# Patient Record
Sex: Male | Born: 1955
Health system: Southern US, Community
[De-identification: ages and names within clinical notes are randomized; demographics above are authoritative.]

## PROBLEM LIST (undated history)

## (undated) DIAGNOSIS — R519 Headache, unspecified: Secondary | ICD-10-CM

## (undated) DIAGNOSIS — R51 Headache: Secondary | ICD-10-CM

## (undated) DIAGNOSIS — M549 Dorsalgia, unspecified: Secondary | ICD-10-CM

## (undated) DIAGNOSIS — K219 Gastro-esophageal reflux disease without esophagitis: Secondary | ICD-10-CM

## (undated) DIAGNOSIS — E7800 Pure hypercholesterolemia, unspecified: Secondary | ICD-10-CM

## (undated) HISTORY — PX: TONSILLECTOMY: SUR1361

## (undated) HISTORY — DX: Dorsalgia, unspecified: M54.9

---

## 1999-11-11 ENCOUNTER — Inpatient Hospital Stay (HOSPITAL_COMMUNITY): Admission: EM | Admit: 1999-11-11 | Discharge: 1999-11-26 | Payer: Self-pay | Admitting: *Deleted

## 1999-12-09 ENCOUNTER — Inpatient Hospital Stay (HOSPITAL_COMMUNITY): Admission: AD | Admit: 1999-12-09 | Discharge: 1999-12-21 | Payer: Self-pay | Admitting: *Deleted

## 2000-01-24 ENCOUNTER — Inpatient Hospital Stay (HOSPITAL_COMMUNITY): Admission: EM | Admit: 2000-01-24 | Discharge: 2000-01-27 | Payer: Self-pay | Admitting: Emergency Medicine

## 2000-07-06 ENCOUNTER — Inpatient Hospital Stay (HOSPITAL_COMMUNITY): Admission: EM | Admit: 2000-07-06 | Discharge: 2000-07-16 | Payer: Self-pay | Admitting: *Deleted

## 2001-12-20 ENCOUNTER — Encounter: Payer: Self-pay | Admitting: Emergency Medicine

## 2001-12-20 ENCOUNTER — Emergency Department (HOSPITAL_COMMUNITY): Admission: EM | Admit: 2001-12-20 | Discharge: 2001-12-20 | Payer: Self-pay | Admitting: Emergency Medicine

## 2001-12-21 ENCOUNTER — Emergency Department (HOSPITAL_COMMUNITY): Admission: EM | Admit: 2001-12-21 | Discharge: 2001-12-21 | Payer: Self-pay | Admitting: Emergency Medicine

## 2001-12-21 ENCOUNTER — Encounter: Payer: Self-pay | Admitting: Emergency Medicine

## 2002-01-28 ENCOUNTER — Ambulatory Visit (HOSPITAL_COMMUNITY): Admission: RE | Admit: 2002-01-28 | Discharge: 2002-01-28 | Payer: Self-pay | Admitting: General Surgery

## 2002-01-28 ENCOUNTER — Encounter: Payer: Self-pay | Admitting: General Surgery

## 2002-03-21 ENCOUNTER — Encounter: Admission: RE | Admit: 2002-03-21 | Discharge: 2002-03-21 | Payer: Self-pay | Admitting: Family Medicine

## 2002-04-05 ENCOUNTER — Encounter: Admission: RE | Admit: 2002-04-05 | Discharge: 2002-04-05 | Payer: Self-pay | Admitting: Family Medicine

## 2002-04-18 ENCOUNTER — Encounter: Admission: RE | Admit: 2002-04-18 | Discharge: 2002-04-18 | Payer: Self-pay | Admitting: Family Medicine

## 2003-02-20 ENCOUNTER — Ambulatory Visit (HOSPITAL_COMMUNITY): Admission: RE | Admit: 2003-02-20 | Discharge: 2003-02-20 | Payer: Self-pay | Admitting: Neurosurgery

## 2003-08-27 ENCOUNTER — Emergency Department (HOSPITAL_COMMUNITY): Admission: EM | Admit: 2003-08-27 | Discharge: 2003-08-27 | Payer: Self-pay | Admitting: Emergency Medicine

## 2004-02-09 ENCOUNTER — Other Ambulatory Visit (HOSPITAL_COMMUNITY): Admission: RE | Admit: 2004-02-09 | Discharge: 2004-02-28 | Payer: Self-pay | Admitting: Psychiatry

## 2004-03-12 ENCOUNTER — Encounter: Admission: RE | Admit: 2004-03-12 | Discharge: 2004-03-12 | Payer: Self-pay | Admitting: Family Medicine

## 2004-03-13 ENCOUNTER — Encounter: Admission: RE | Admit: 2004-03-13 | Discharge: 2004-03-13 | Payer: Self-pay | Admitting: Psychiatry

## 2004-05-23 ENCOUNTER — Emergency Department (HOSPITAL_COMMUNITY): Admission: EM | Admit: 2004-05-23 | Discharge: 2004-05-23 | Payer: Self-pay | Admitting: *Deleted

## 2004-07-22 ENCOUNTER — Emergency Department (HOSPITAL_COMMUNITY): Admission: EM | Admit: 2004-07-22 | Discharge: 2004-07-22 | Payer: Self-pay | Admitting: Emergency Medicine

## 2005-03-20 ENCOUNTER — Ambulatory Visit (HOSPITAL_COMMUNITY): Admission: RE | Admit: 2005-03-20 | Discharge: 2005-03-20 | Payer: Self-pay | Admitting: Internal Medicine

## 2005-04-20 ENCOUNTER — Ambulatory Visit (HOSPITAL_BASED_OUTPATIENT_CLINIC_OR_DEPARTMENT_OTHER): Admission: RE | Admit: 2005-04-20 | Discharge: 2005-04-20 | Payer: Self-pay | Admitting: Internal Medicine

## 2005-04-27 ENCOUNTER — Ambulatory Visit: Payer: Self-pay | Admitting: Internal Medicine

## 2005-05-19 ENCOUNTER — Ambulatory Visit (HOSPITAL_BASED_OUTPATIENT_CLINIC_OR_DEPARTMENT_OTHER): Admission: RE | Admit: 2005-05-19 | Discharge: 2005-05-19 | Payer: Self-pay | Admitting: Internal Medicine

## 2005-06-06 ENCOUNTER — Ambulatory Visit: Payer: Self-pay | Admitting: Internal Medicine

## 2005-07-04 ENCOUNTER — Ambulatory Visit: Payer: Self-pay | Admitting: Internal Medicine

## 2005-08-18 ENCOUNTER — Ambulatory Visit (HOSPITAL_COMMUNITY): Admission: RE | Admit: 2005-08-18 | Discharge: 2005-08-18 | Payer: Self-pay | Admitting: Specialist

## 2005-08-18 ENCOUNTER — Ambulatory Visit (HOSPITAL_BASED_OUTPATIENT_CLINIC_OR_DEPARTMENT_OTHER): Admission: RE | Admit: 2005-08-18 | Discharge: 2005-08-18 | Payer: Self-pay | Admitting: Specialist

## 2005-08-18 ENCOUNTER — Encounter (INDEPENDENT_AMBULATORY_CARE_PROVIDER_SITE_OTHER): Payer: Self-pay | Admitting: Specialist

## 2005-11-27 ENCOUNTER — Ambulatory Visit: Payer: Self-pay | Admitting: Internal Medicine

## 2006-01-01 ENCOUNTER — Emergency Department (HOSPITAL_COMMUNITY): Admission: EM | Admit: 2006-01-01 | Discharge: 2006-01-01 | Payer: Self-pay | Admitting: Emergency Medicine

## 2006-01-06 ENCOUNTER — Emergency Department (HOSPITAL_COMMUNITY): Admission: EM | Admit: 2006-01-06 | Discharge: 2006-01-07 | Payer: Self-pay | Admitting: Emergency Medicine

## 2006-01-08 ENCOUNTER — Emergency Department (HOSPITAL_COMMUNITY): Admission: EM | Admit: 2006-01-08 | Discharge: 2006-01-08 | Payer: Self-pay | Admitting: Emergency Medicine

## 2006-05-05 ENCOUNTER — Emergency Department (HOSPITAL_COMMUNITY): Admission: EM | Admit: 2006-05-05 | Discharge: 2006-05-06 | Payer: Self-pay | Admitting: Emergency Medicine

## 2006-10-28 ENCOUNTER — Ambulatory Visit (HOSPITAL_COMMUNITY): Payer: Self-pay | Admitting: Psychiatry

## 2006-11-11 ENCOUNTER — Ambulatory Visit (HOSPITAL_COMMUNITY): Payer: Self-pay | Admitting: Psychiatry

## 2006-12-09 ENCOUNTER — Ambulatory Visit (HOSPITAL_COMMUNITY): Payer: Self-pay | Admitting: Psychiatry

## 2007-02-08 ENCOUNTER — Ambulatory Visit (HOSPITAL_COMMUNITY): Payer: Self-pay | Admitting: Psychiatry

## 2007-04-21 ENCOUNTER — Ambulatory Visit (HOSPITAL_COMMUNITY): Payer: Self-pay | Admitting: Psychiatry

## 2007-06-21 ENCOUNTER — Ambulatory Visit (HOSPITAL_COMMUNITY): Payer: Self-pay | Admitting: Psychiatry

## 2007-09-08 ENCOUNTER — Ambulatory Visit (HOSPITAL_COMMUNITY): Payer: Self-pay | Admitting: Psychiatry

## 2007-11-14 ENCOUNTER — Emergency Department (HOSPITAL_COMMUNITY): Admission: EM | Admit: 2007-11-14 | Discharge: 2007-11-14 | Payer: Self-pay | Admitting: Emergency Medicine

## 2007-11-21 ENCOUNTER — Emergency Department (HOSPITAL_COMMUNITY): Admission: EM | Admit: 2007-11-21 | Discharge: 2007-11-21 | Payer: Self-pay | Admitting: Emergency Medicine

## 2008-01-08 ENCOUNTER — Emergency Department (HOSPITAL_COMMUNITY): Admission: EM | Admit: 2008-01-08 | Discharge: 2008-01-08 | Payer: Self-pay | Admitting: Emergency Medicine

## 2008-01-11 ENCOUNTER — Emergency Department (HOSPITAL_COMMUNITY): Admission: EM | Admit: 2008-01-11 | Discharge: 2008-01-11 | Payer: Self-pay | Admitting: Emergency Medicine

## 2008-01-17 ENCOUNTER — Ambulatory Visit (HOSPITAL_COMMUNITY): Payer: Self-pay | Admitting: Psychiatry

## 2008-01-24 ENCOUNTER — Emergency Department (HOSPITAL_COMMUNITY): Admission: EM | Admit: 2008-01-24 | Discharge: 2008-01-24 | Payer: Self-pay | Admitting: Emergency Medicine

## 2008-02-05 ENCOUNTER — Emergency Department (HOSPITAL_COMMUNITY): Admission: EM | Admit: 2008-02-05 | Discharge: 2008-02-05 | Payer: Self-pay | Admitting: Emergency Medicine

## 2008-02-28 ENCOUNTER — Emergency Department (HOSPITAL_COMMUNITY): Admission: EM | Admit: 2008-02-28 | Discharge: 2008-02-28 | Payer: Self-pay | Admitting: Emergency Medicine

## 2008-04-03 ENCOUNTER — Ambulatory Visit (HOSPITAL_COMMUNITY): Payer: Self-pay | Admitting: Psychiatry

## 2008-04-04 ENCOUNTER — Emergency Department (HOSPITAL_COMMUNITY): Admission: EM | Admit: 2008-04-04 | Discharge: 2008-04-04 | Payer: Self-pay | Admitting: Emergency Medicine

## 2008-07-10 ENCOUNTER — Emergency Department (HOSPITAL_COMMUNITY): Admission: EM | Admit: 2008-07-10 | Discharge: 2008-07-10 | Payer: Self-pay | Admitting: Emergency Medicine

## 2008-08-06 ENCOUNTER — Emergency Department (HOSPITAL_COMMUNITY): Admission: EM | Admit: 2008-08-06 | Discharge: 2008-08-06 | Payer: Self-pay | Admitting: Emergency Medicine

## 2008-08-16 ENCOUNTER — Ambulatory Visit (HOSPITAL_COMMUNITY): Payer: Self-pay | Admitting: Psychiatry

## 2008-09-13 ENCOUNTER — Ambulatory Visit (HOSPITAL_COMMUNITY): Payer: Self-pay | Admitting: Psychiatry

## 2008-11-01 ENCOUNTER — Ambulatory Visit (HOSPITAL_COMMUNITY): Payer: Self-pay | Admitting: Psychiatry

## 2009-01-31 ENCOUNTER — Ambulatory Visit (HOSPITAL_COMMUNITY): Payer: Self-pay | Admitting: Psychiatry

## 2009-04-30 ENCOUNTER — Ambulatory Visit (HOSPITAL_COMMUNITY): Payer: Self-pay | Admitting: Psychiatry

## 2009-05-13 ENCOUNTER — Emergency Department (HOSPITAL_COMMUNITY): Admission: EM | Admit: 2009-05-13 | Discharge: 2009-05-13 | Payer: Self-pay | Admitting: Emergency Medicine

## 2009-08-20 ENCOUNTER — Ambulatory Visit (HOSPITAL_COMMUNITY): Payer: Self-pay | Admitting: Psychiatry

## 2009-11-26 ENCOUNTER — Ambulatory Visit (HOSPITAL_COMMUNITY): Payer: Self-pay | Admitting: Psychiatry

## 2010-02-18 ENCOUNTER — Ambulatory Visit (HOSPITAL_COMMUNITY): Payer: Self-pay | Admitting: Psychiatry

## 2010-04-15 ENCOUNTER — Ambulatory Visit (HOSPITAL_COMMUNITY): Payer: Self-pay | Admitting: Psychiatry

## 2010-07-17 ENCOUNTER — Ambulatory Visit (HOSPITAL_COMMUNITY): Payer: Self-pay | Admitting: Psychiatry

## 2010-10-07 ENCOUNTER — Ambulatory Visit (HOSPITAL_COMMUNITY): Payer: Self-pay | Admitting: Psychiatry

## 2010-12-09 ENCOUNTER — Ambulatory Visit (HOSPITAL_COMMUNITY): Payer: Self-pay | Admitting: Psychiatry

## 2011-01-19 ENCOUNTER — Encounter: Payer: Self-pay | Admitting: Internal Medicine

## 2011-03-03 ENCOUNTER — Encounter (HOSPITAL_COMMUNITY): Payer: Self-pay | Admitting: Psychiatry

## 2011-03-04 ENCOUNTER — Encounter (HOSPITAL_COMMUNITY): Payer: Self-pay | Admitting: *Deleted

## 2011-05-16 NOTE — Procedures (Signed)
Roger Lopez, Roger Lopez                ACCOUNT NO.:  [id number]   MEDICAL RECORD NO.:  [id number]          PATIENT TYPE:  OUT   LOCATION:  SLEEP CENTER                 FACILITY:  [facility]   PHYSICIAN:  Clinton D. Maple Hudson, M.D. DATE OF BIRTH:  May 13, 1956   DATE OF STUDY:  05/19/2005                              NOCTURNAL POLYSOMNOGRAM   REFERRING PHYSICIAN:  Dr. Fleet Contras   DATE OF STUDY:  May 19, 2005   INDICATION FOR STUDY:  Hypersomnia with sleep apnea.  Epworth Sleepiness  Score 9/24, BMI 34, weight 250 pounds.  Baseline diagnostic study on April 20, 2005 recorded RDI of 39.8 per hour.  CPAP titration is requested.   SLEEP ARCHITECTURE:  Total sleep time 334 minutes with sleep efficiency 91%.  Stage I was 5%, stage II 39%, stages III and IV 23%, REM was 33% of total  sleep time.  Sleep latency 10 minutes, REM latency 1.5 minutes, awake after  sleep onset 22 minutes, arousal index 23.   RESPIRATORY DATA:  CPAP titration protocol.  CPAP was titrated to 12 CWP,  RDI 0 per hour, using a large Respironics ComfortSelect 2 Full Face Mask  with heated humidifier.   OXYGEN DATA:  Moderate snoring was suppressed by CPAP with oxygen saturation  on CPAP 95-97% on room air.   CARDIAC DATA:  Normal sinus rhythm.   MOVEMENT/PARASOMNIA:  A total of 70 limb jerks were recorded of which 10  were associated arousal or awakening for a periodic limb movement with  arousal index of 1.8 per hour which is probably insignificant.   IMPRESSION/RECOMMENDATION:  1.  Successful continuous positive airway pressure titration to 12 CWP,      respiratory disturbance index 0 per hour, using a large Respironics      ComfortSelect 2 Full Face Mask with heated humidifier.  2.  Baseline NPSG on April 20, 2005 had recorded an respiratory disturbance      index of 39.8 per hour.      CDY/MEDQ  D:  05/25/2005 11:48:38  T:  05/25/2005 12:18:12  Job:  [id number]

## 2011-05-16 NOTE — Procedures (Signed)
NAMEGERARDO, CAIAZZO                ACCOUNT NO.:  [id number]   MEDICAL RECORD NO.:  [id number]          PATIENT TYPE:  OUT   LOCATION:  SLEEP CENTER                 FACILITY:  [facility]   PHYSICIAN:  Clinton D. Maple Hudson, M.D. DATE OF BIRTH:  Jun 06, 1956   DATE OF STUDY:  04/20/2005                              NOCTURNAL POLYSOMNOGRAM   REFERRING PHYSICIAN:  Fleet Contras, M.D.   INDICATIONS FOR STUDY:  Hypersomnia with sleep apnea. Epworth sleepiness  score 9/24, BMI 34, weight 250 pounds.   SLEEP ARCHITECTURE:  Total sleep time 259 minutes with sleep efficiency of  66%. Stage I was 9%, stage II was 57%, stages III and IV were 23%, REM was  11% of total sleep time. Sleep latency was 93 minutes. REM latency 115  minutes. Awake after sleep onset 45 minutes. Arousal index 43.   RESPIRATORY DATA:  Respiratory disturbance index (RDI, AHI) 9.8 obstructive  events per hour indicating severe obstructive sleep apnea/hypopnea syndrome.  This included 7 central apneas, 75 obstructive apneas, and 90 hypopneas.  Events and sleep were mostly while supine and on the left side.  REM RDI  124.  Sleep onset was delayed until 1:06 a.m.  The patient stated he had  difficulty sleeping because of the different atmosphere and air ducts.  There was insufficient remaining time to permit CPAP titration by protocol  on this study night.   OXYGEN DATA:  Moderate snoring with oxygen desaturation to a nadir to 76%.  Mean oxygen saturation through the study was 93% on room air.   CARDIAC DATA:  Normal sinus rhythm.   MOVEMENT/PARASOMNIA:  Occasional leg jerk. Bathroom times two.   IMPRESSION/RECOMMENDATIONS:  1.  Severe obstructive sleep apnea/hypopnea syndrome, RDI 39.8 per hour with      moderate snoring and oxygen desaturation to 76%. Note that this patient      has prior history of uvuloloplasty surgery.  2.  Consider return for CPAP titration or evaluate for alternative therapies      as appropriate. Would  suggest he bring a sleep medication with him on      return to facilitate study.     CDY/MEDQ  D:  04/27/2005 12:17:25  T:  04/27/2005 20:42:53  Job:  [id number]

## 2011-05-16 NOTE — Discharge Summary (Signed)
[place]  Patient:    Roger Lopez                        MRN: [id number] Adm. Date:  [id number] Disc. Date: [id number] Attending:  Farley Ly Dictator:   Marcelino Duster, M.D.                           Discharge Summary  FOLLOWUP APPOINTMENT:  In the [facility] on Friday, February 07, 2000 at 9:30 a.m.  CONSULTATIONS:  none.  DISCHARGE DIAGNOSES: 1. Acute prostatitis. 2. Hypotension. 3. Anemia. 4. History of depression.  DISCHARGE MEDICATIONS: 1. Ciprofloxacin 500 mg p.o. b.i.d. x 11 days (to complete a 14-day course). 2. Darvocet N-100 p.o. q.4-6h. p.r.n. pain (#20).  PROCEDURES:  None.  STUDIES:  None.  ADMISSION HISTORY:  Roger Lopez is a 55 year old African-American male with a past medical history significant for depression who presented with a three day history of increased urinary frequency and urgency and a one day history of fever, dysuria, perineal pain, perirectal and lower back pain, penile pain, increased urinary frequency, anorexia, rigors, and nausea and vomiting x 3.  He denies sexual contact in 1.5 months.  No penile discharge.  Has been producing dark urine.  ADMISSION PHYSICAL EXAMINATION:  VITAL SIGNS:  Temperature 100.1, blood pressure 80/46, pulse 94, and respirations 20.  Orthostatic blood pressure sitting was 76/50.  Blood pressure lying 97/56 ith a pulse of 78.  Blood pressure standing was 63/44 with a pulse of 114.  GENERAL:  The patient is alert and oriented seen lying in hospital bed shivering.  HEENT:  Head: NCAT.  Eyes:  Sclera anicteric.  Conjunctivae pink.  Oropharynx:  Clear without exudate.  NECK:  Supple.  No palpable lymphadenopathy.  CARDIAC:  Distant heart sounds.  Tachycardic.  Questionable S3 gallop.  No murmurs.  RESPIRATORY:  Clear to auscultation.  Good air movement.  ABDOMEN:  Soft.  NTND.  Positive bowel sounds.  No hepatosplenomegaly.  No masses.  RECTAL:  Enlarged, boggy,  very tender prostate.  Heme-negative.  EXTREMITIES:  No clubbing, cyanosis, or edema.  DP and radial pulses palpable bilaterally.  Extremities were warm to touch.  MUSCULOSKELETAL:  Positive tenderness to palpation perianally and over sacral region.  No CVA tenderness.  NEUROLOGIC:  Cranial nerves II-XII were grossly intact.  Sensation grossly intact bilaterally throughout.  LABORATORY DATA:  UA: Amber, turbid, positive for hemoglobin, protein, nitrates, leuk, negative for glucose, and positive for ketones.  WBC too numerous to count, RBC 21-50, and bacteria many.  WBC 10.9, hemoglobin 13.9, hematocrit 40.5, platelets 244, MCV 91, and ANC 8.6.  Sodium 136, potassium 3.8, chloride 102, CO2 33, BUN 13, creatinine 1.4, glucose 103, and calcium 9.4.  Total protein was 7.0, albumin 3.7, AST 26, ALT 16, ALP 47, and total bilirubin 0.9.  HOSPITAL COURSE: #1 - ACUTE PROSTATITIS:  Roger Lopez presented with a three day history of increased urinary frequency and a one day history of dysuria, fever, perineal/perianal pain, chills, nausea, and vomiting.  He was treated in the ED with IV Levaquin and azithromycin with Tylenol for pain.  His UA demonstrated positive for nitrates/leuk/hemoglobin/protein.  Rectal exam demonstrated significantly tender, enlarged, boggy prostate.  This plus the patients history was also ______  for acute prostatitis.  Urine culture grew E. coli that was sensitive to ciprofloxacin, therefore, the patients antibiotics were changed to Cipro 500 mg  IV b.i.d.  He as admitted to the step-down unit for observation secondary to questionable shock/sepsis (please see hospital course #2).  He was treated with two days of V antibiotics and then changed to Cipro p.o.  The patient had significantly decreasing dysuria throughout his hospital stay.  He was discharged on Cipro 500 mg p.o. b.i.d. for 11 days to complete a total of a 14 day course of antibiotics. He was also  discharged with Darvocet N-100 for pain.  He will follow up in the [place] [facility] on February 07, 2000 at 9:30 a.m.  #2 - HYPOTENSION:  On presentation, the patients blood pressure was 80/46, and e was significantly orthostatic by blood pressure and pulse (please see admission H&P under physical exam).  Concern about septic shock.  Therefore he was admitted to the step-down unit.  Blood cultures were drawn, and antibiotics were begun. His calculated water deficit was about 9 L.  Therefore, he was bolused with 7 L of normal saline (the patient was eating and drinking, and therefore we did not use D5).  After his bolus, he received 2-3 more liters at a rapid infusion rate. The next morning, his blood pressure had improved to 118/55, and his pulse decreased to 70-80.  His blood cultures were negative, and he was afebrile after antibiotics  were begun.  Therefore, the patient was transferred to the floor the day after admission.  He had no further evidence of sepsis/shock.  The patient felt well pon discharge.  #3 - ANEMIA:  Initial hemoglobin/hematocrit was 13.9/40.5, however, the patient was significantly orthostatic and dehydrated.  After fluid replacement, his hemoglobin/hematocrit decreased to 11.7/34.3.  Therefore, iron studies and B12/folate were gotten to characterize his anemia.  His total iron was low at 12. His TIBC was low at 212.  Iron saturation was low at 6%.  Ferritin was within normal limits at 211.  B12 and folate were also within normal limits at 358 and 9.0 respectively.  His low serum iron, low TIBC, and normal ferritin was suggestive of anemia of chronic disease.  The patient denies any known medical illnesses. The patient was asymptomatic from a heme standpoint at discharge.  Therefore, this should be worked-up as an outpatient.  DISCHARGE LABORATORY DATA:  None. DD:  01/27/00 TD:  01/27/00 Job: [id number]

## 2011-05-16 NOTE — H&P (Signed)
[facility]  Patient:    Roger Lopez, Roger Lopez                       MRN: [id number] Adm. Date:  [id number] Attending:  Otilio Saber Dictator:   Valinda Hoar, N.P.                   Psychiatric Admission Assessment  DATE OF ADMISSION:  July 06, 2000  IDENTIFYING INFORMATION:  Mr. Roger Lopez is a 55 year old African-American separated male admitted July 06, 2000, on a voluntary basis, and he was having command hallucinations telling him to kill himself.  HISTORY OF PRESENT ILLNESS:  The patient presented to [facility] ED last night stating that he was having command hallucinations to kill himself. Apparently, the voices have intensified over the last week.  Sleep has been poor, averaging two to three hours a night.  Appetite poor, with a weight loss of 15 pounds in one month.  He has been noncompliant with his medications.  He stopped his antipsychotic about three months ago and stopped his Prozac and trazodone about one week ago.  He stated he was afraid of what might happen if he was not admitted to the hospital last night.  He describes decreased energy, social withdrawal, takes naps during the day, anhedonia, angry, irritable.  Apparently, he went to [place], [place], to reconcile with his wife, and they were unable to reconcile.  He has been in [place] for approximately 1-1/2 weeks.  He also lists as a stressor his first cousin died of AIDS in 30-Apr-2000.  He reports that he has not really consumed that much alcohol.  He states he drank beer on the Fourth of July, and that was the first time he had had beer in four months.  Smoked marijuana July 05, 2000.  He said he smoked one joint.  He states he has not used crack cocaine for two years.  PAST PSYCHIATRIC HISTORY:  The patient has been in [place], [place], in treatment.  Was at the outpatient clinic at the mental health center in [place] up until 1-1/2 weeks ago.  He has been hospitalized at [facility] x 2, [facility] in [place] x 2, and he has been a past patient at [facility]; however, he has not gone there for treatment.  PAST MEDICAL HISTORY:  The patient has no primary care doctor.  Goes to the emergency room if he needs any medical care.  MEDICAL PROBLEMS:  The only thing he says he has is migraine headaches.  CURRENT MEDICATIONS: 1. Prozac 20 mg q.a.m. 2. Trazodone 100 mg at h.s.  He stopped these medications one week ago.  He thinks he was on Risperdal, and the dose is unknown, but he stopped that three months ago.  DRUG ALLERGIES:  No known drug allergies.  SOCIAL HISTORY:  The patient is living in an aunts house, has a roommate. He, again, has been in [place] since last week.  He tried to reconcile with his second wife in [place].  This did not work.  He had been married to her for five years.  He has three children from his first marriage, and they are ages 62, 33, 25, and live in [place].  He completed high school.  He, apparently, was in the [employer]	[place] for five years, and is on disability due to his mental illness.  FAMILY HISTORY:  Two sisters have schizophrenia.  ALCOHOL AND DRUG  HISTORY:  The patient states that he started using cocaine in 1993, alcohol at the age of 72, marijuana at the age of 60.  PHYSICAL EXAMINATION:  Please see physical exam done at [facility] ED.  There were no significant findings.  This was done on July 06, 2000.  MENTAL STATUS EXAMINATION:  Middle-aged adult male who is dressed in a hospitaL gown.  He is cooperative.  Speech low, relevant.  Mood sad, affect flat.  Positive for suicidal ideation.  Negative for homicidal ideation. Thought process positive for auditory hallucinations with command hallucinations.  Mild paranoia.  Suicidal, with a plan to kill self with gun. Cognitive:  Alert and oriented.  Cognitive functions intact.  Judgment is poor, insight is poor.  ADMISSION  DIAGNOSES: Axis I:    1. Schizophrenia, chronic, undifferentiated.            2. Polysubstance abuse. Axis II:   Deferred. Axis III:  Migraine headaches. Axis IV:   Severe, related to problems in the social environment, support            group. Axis V:    Current global assessment of functioning 39, past year 60.  CURRENT TREATMENT PLAN AND RECOMMENDATIONS:  Voluntary admission to [facility] Unit.  Check every 15 minutes, and maintain safety.  The patient can contract for safety.  He was given on admission Seroquel 50 mg p.o. stat.  He also has an order for Ambien 10 mg p.o. q.h.s. p.r.n.  We will review his last chart when he was here to determine what medications he was discharged on, since they seemed to help, before we order other medications. He does acknowledge he needs to be in the dual-diagnosis group and ______ .  TENTATIVE LENGTH OF STAY AND DISCHARGE PLAN:  Three days. DD:  07/06/00 TD:  07/06/00 Job: [id number]

## 2011-05-16 NOTE — Op Note (Signed)
Roger Lopez, Roger Lopez                ACCOUNT NO.:  [id number]   MEDICAL RECORD NO.:  [id number]          PATIENT TYPE:  AMB   LOCATION:  DSC                          FACILITY:  [facility]   PHYSICIAN:  Earvin Hansen L. Truesdale, M.D.DATE OF BIRTH:  [birth year]   DATE OF PROCEDURE:  08/18/2005  DATE OF DISCHARGE:                                 OPERATIVE REPORT   HISTORY OF PRESENT ILLNESS:  This is a 55 year old gentleman with a large  mass involving his lower mid back area.  The patient states that the mass  has gotten larger over the last year, year and a half with increased pain  and discomfort to the point where if he just puts pressure on the area it is  very uncomfortable.  It is in the midline of the back and therefore we are  going to use MAC anesthesia.  Day center surgery.   PROCEDURE:  Exploration and closure with plastic closure removal.   ANESTHESIA:  MAC anesthesia.   DESCRIPTION OF PROCEDURE:  The patient was taken to the operating room and  placed on the operating room table in the prone position.  He was given MAC  anesthesia.  Prep was done to the back using Hibiclens soap and solution and  walled off with sterile towels and drape to make a sterile field.  Then 1/2%  Xylocaine with epinephrine 1:200,000 concentration was injected locally a  total of 10 mL.  This was allowed to sit up and after this an incision was  made over the mass transversely, carried down through skin with #15 blade  and underlying deep subcutaneous tissue.  Next using Stevens scissors I was  able to identify the mass and using sharp and blunt dissection I was able to  remove the mass from the area deep under the fascia of the paraspinal  muscle.  After proper hemostasis the area was irrigated.  The wounds were  then closed in layers in a plastic fashion with 3-0 Monocryl x2 layers deep  subcutaneous tissue and a subdermal suture of 5-0 Monocryl and then a  running subcuticular stitch of 5-0 Monocryl.   Steri-strips and soft  dressings were applied to all the areas.  He withstood the procedures very  well and was taken to the recovery in excellent condition.      Roger Lopez, M.D.  Electronically Signed     GLT/MEDQ  D:  08/18/2005  T:  08/18/2005  Job:  [id number]

## 2011-07-14 ENCOUNTER — Encounter (HOSPITAL_COMMUNITY): Payer: Self-pay | Admitting: Psychiatry

## 2011-07-14 DIAGNOSIS — F259 Schizoaffective disorder, unspecified: Secondary | ICD-10-CM

## 2011-09-08 ENCOUNTER — Encounter (INDEPENDENT_AMBULATORY_CARE_PROVIDER_SITE_OTHER): Payer: Medicare Other | Admitting: Psychiatry

## 2011-09-08 DIAGNOSIS — F259 Schizoaffective disorder, unspecified: Secondary | ICD-10-CM

## 2011-09-18 LAB — URINALYSIS, ROUTINE W REFLEX MICROSCOPIC
Bilirubin Urine: NEGATIVE
Glucose, UA: NEGATIVE
Glucose, UA: NEGATIVE
Hgb urine dipstick: NEGATIVE
Hgb urine dipstick: NEGATIVE
Ketones, ur: NEGATIVE
Nitrite: NEGATIVE
Protein, ur: NEGATIVE
Specific Gravity, Urine: 1.021
Urobilinogen, UA: 0.2
pH: 5
pH: 6.5

## 2011-09-18 LAB — DIFFERENTIAL
Basophils Absolute: 0
Basophils Relative: 0
Eosinophils Absolute: 0.1
Eosinophils Relative: 1
Lymphocytes Relative: 55 — ABNORMAL HIGH
Lymphs Abs: 2.7
Monocytes Absolute: 0.3
Monocytes Relative: 7
Neutro Abs: 1.8
Neutrophils Relative %: 37 — ABNORMAL LOW

## 2011-09-18 LAB — BASIC METABOLIC PANEL
GFR calc non Af Amer: >60
Glucose, Bld: 102 — ABNORMAL HIGH
Potassium: 3.8
Sodium: 131 — ABNORMAL LOW

## 2011-09-18 LAB — CBC
HCT: 42
MCHC: 34.4
Platelets: 228
RBC: 4.53
WBC: 4.9

## 2011-11-01 ENCOUNTER — Other Ambulatory Visit (HOSPITAL_COMMUNITY): Payer: Self-pay

## 2011-11-03 ENCOUNTER — Encounter (HOSPITAL_COMMUNITY): Payer: Medicare Other | Admitting: Psychiatry

## 2011-11-26 ENCOUNTER — Ambulatory Visit (HOSPITAL_COMMUNITY): Payer: Medicare Other | Admitting: Psychiatry

## 2011-11-26 DIAGNOSIS — F200 Paranoid schizophrenia: Secondary | ICD-10-CM

## 2011-11-26 MED ORDER — BENZTROPINE MESYLATE 0.5 MG PO TABS: 0.5000 mg | ORAL_TABLET | Freq: Every day | ORAL | Status: DC

## 2011-11-26 MED ORDER — RISPERIDONE 2 MG PO TABS: 2.0000 mg | ORAL_TABLET | Freq: Every day | ORAL | Status: DC

## 2011-11-26 MED ORDER — TRAZODONE HCL 150 MG PO TABS: 150.0000 mg | ORAL_TABLET | Freq: Every day | ORAL | Status: DC

## 2011-11-26 NOTE — Progress Notes (Signed)
Thinking for his followup appointment. He is taking now trazodone 150 mg at bedtime. He is sleeping better. Recently he has no paranoid thinking or any hallucination. He described his mood is stable. There is no agitation anger or violent behavior. She's excited as she started coming with grandchild from [place] to visit and Christmas. He is very excited to have visit. Overall he has been doing good on medicine. He denies any side effects of medication including any tremors shakes hot extrapyramidal side effects.  Medicine examination Patient is casually dressed. He maintained good eye contact. His speech is soft clear and coherent. He denies any auditory hallucinations suicidal thoughts or homicidal thoughts. There are no psychotic symptoms present at this time. He is alert and oriented x3. His his attention and concentration is fair. His insight judgment and impulse control is okay.  Plan I will continue his current medication which is Risperdal 2 mg at bedtime Cogentin 0.5 mg at bedtime and trazodone 150 mg at bedtime. I review his blood work which was done in October 19, his CBC and basic chemistry is normal other than mild elevation of AST 47 and mild elevation of ALT 42, his BUN/creatinine is within normal limits. I have explained risks and benefits of medication and I will see him again in 2 months

## 2011-12-31 ENCOUNTER — Other Ambulatory Visit (HOSPITAL_COMMUNITY): Payer: Self-pay | Admitting: Psychiatry

## 2012-01-26 ENCOUNTER — Ambulatory Visit (INDEPENDENT_AMBULATORY_CARE_PROVIDER_SITE_OTHER): Payer: Medicare Other | Admitting: Psychiatry

## 2012-01-26 DIAGNOSIS — F200 Paranoid schizophrenia: Secondary | ICD-10-CM

## 2012-01-26 MED ORDER — BENZTROPINE MESYLATE 0.5 MG PO TABS: 0.5000 mg | ORAL_TABLET | Freq: Every day | ORAL | Status: DC

## 2012-01-26 MED ORDER — TRAZODONE HCL 150 MG PO TABS: 150.0000 mg | ORAL_TABLET | Freq: Every day | ORAL | Status: DC

## 2012-01-26 MED ORDER — RISPERIDONE 2 MG PO TABS: 2.0000 mg | ORAL_TABLET | Freq: Every day | ORAL | Status: DC

## 2012-01-26 NOTE — Progress Notes (Signed)
Patient came for his followup appointment. He's been compliant with his medication. He denies any side effects. His paranoia is less intense and less frequent. Recently he denies any hallucination. He had a very good Christmas and holidays. He likes to continue on his current medication. He denies any agitation anger or mood swings. He has no tremors shakes or extrapyramidal side effects.    Mental status examination Patient is casually dressed. He maintained good eye contact. His speech is soft clear and coherent. He denies any auditory hallucinations suicidal thoughts or homicidal thoughts. There are no psychotic symptoms present at this time. He is alert and oriented x3. His his attention and concentration is fair. His insight judgment and impulse control is okay.  Plan I will continue his current medication which is Risperdal 2 mg at bedtime Cogentin 0.5 mg at bedtime and trazodone 150 mg at bedtime.I have explained risks and benefits of medication and I will see him again in 2 months

## 2012-03-21 ENCOUNTER — Emergency Department (HOSPITAL_COMMUNITY)
Admission: EM | Admit: 2012-03-21 | Discharge: 2012-03-21 | Disposition: A | Discharge status: Home / Self Care | Payer: Medicare Other | Attending: Emergency Medicine | Admitting: Emergency Medicine

## 2012-03-21 ENCOUNTER — Encounter (HOSPITAL_COMMUNITY): Payer: Self-pay

## 2012-03-21 DIAGNOSIS — M79609 Pain in unspecified limb: Secondary | ICD-10-CM | POA: Insufficient documentation

## 2012-03-21 DIAGNOSIS — M545 Low back pain, unspecified: Secondary | ICD-10-CM | POA: Insufficient documentation

## 2012-03-21 DIAGNOSIS — M549 Dorsalgia, unspecified: Secondary | ICD-10-CM

## 2012-03-21 MED ORDER — HYDROMORPHONE HCL PF 1 MG/ML IJ SOLN: 1.0000 mg | Freq: Once | INTRAMUSCULAR | Status: DC

## 2012-03-21 MED ORDER — OXYCODONE-ACETAMINOPHEN 5-325 MG PO TABS: 1.0000 | ORAL_TABLET | Freq: Four times a day (QID) | ORAL | Status: AC | PRN

## 2012-03-21 MED ORDER — CYCLOBENZAPRINE HCL 10 MG PO TABS
10.0000 mg | ORAL_TABLET | Freq: Once | ORAL | Status: AC
  Administered 2012-03-21: 10 mg via ORAL
  Filled 2012-03-21: qty 1

## 2012-03-21 MED ORDER — ONDANSETRON HCL 4 MG/2ML IJ SOLN: 4.0000 mg | Freq: Once | INTRAMUSCULAR | Status: DC

## 2012-03-21 MED ORDER — ONDANSETRON 8 MG PO TBDP
8.0000 mg | ORAL_TABLET | Freq: Once | ORAL | Status: AC
  Administered 2012-03-21: 8 mg via ORAL
  Filled 2012-03-21: qty 1

## 2012-03-21 MED ORDER — HYDROMORPHONE HCL PF 2 MG/ML IJ SOLN
2.0000 mg | Freq: Once | INTRAMUSCULAR | Status: AC
  Administered 2012-03-21: 2 mg via INTRAMUSCULAR
  Filled 2012-03-21: qty 1

## 2012-03-21 MED ORDER — CYCLOBENZAPRINE HCL 10 MG PO TABS: 10.0000 mg | ORAL_TABLET | Freq: Two times a day (BID) | ORAL | Status: AC | PRN

## 2012-03-21 NOTE — ED Provider Notes (Signed)
History     CSN: [id number]  Arrival date & time 03/21/12  1535   First MD Initiated Contact with Patient 03/21/12 1724      Chief Complaint  Patient presents with  . Back Pain    (Consider location/radiation/quality/duration/timing/severity/associated sxs/prior treatment) HPI Comments: Patient with history of sciatica presents with bilateral lower back pain shooting down into both legs, left worse than right, for the past 4 days. Patient denies red flag signs and symptoms of lower back pain. He states that he sees Dr. Ethelene Hal for injections in his back. He has not needed them for approximately 3 years. Has been using over-the-counter medications at home for pain without relief. Nothing makes the pain better or worse.  Patient is a 56 y.o. male presenting with back pain. The history is provided by the patient.  Back Pain  This is a recurrent problem. The current episode started more than 2 days ago. The problem occurs constantly. The problem has not changed since onset.The pain is associated with no known injury. The pain is present in the lumbar spine. The quality of the pain is described as shooting. The pain radiates to the left thigh and right thigh. Pertinent negatives include no fever, no numbness, no weight loss, no bowel incontinence, no perianal numbness, no bladder incontinence, no paresthesias and no weakness. He has tried NSAIDs for the symptoms.    History reviewed. No pertinent past medical history.  History reviewed. No pertinent past surgical history.  No family history on file.  History  Substance Use Topics  . Smoking status: Never Smoker   . Smokeless tobacco: Not on file  . Alcohol Use:       Review of Systems  Constitutional: Negative for fever, weight loss and unexpected weight change.  Gastrointestinal: Negative for constipation and bowel incontinence.       Neg for fecal incontinence  Genitourinary: Negative for bladder incontinence, hematuria, flank  pain and difficulty urinating.       Negative for urinary incontinence or retention  Musculoskeletal: Positive for back pain.  Neurological: Negative for weakness, numbness and paresthesias.       Negative for saddle paresthesias     Allergies  Review of patient's allergies indicates no known allergies.  Home Medications   Current Outpatient Rx  Name Route Sig Dispense Refill  . ATORVASTATIN CALCIUM 40 MG PO TABS Oral Take 40 mg by mouth daily.     Marland Kitchen BENZTROPINE MESYLATE 0.5 MG PO TABS Oral Take 1 tablet (0.5 mg total) by mouth at bedtime. 30 tablet 1  . FLUTICASONE PROPIONATE 50 MCG/ACT NA SUSP      . NAPROXEN SODIUM 220 MG PO TABS Oral Take 440 mg by mouth 2 (two) times daily with a meal.    . NEXIUM 40 MG PO CPDR Oral Take 40 mg by mouth daily.     . TRAMADOL HCL 50 MG PO TABS      . TRAZODONE HCL 150 MG PO TABS Oral Take 1 tablet (150 mg total) by mouth at bedtime. 30 tablet 1    BP 143/77  Pulse 86  Temp(Src) 98.3 F (36.8 C) (Oral)  Resp 20  SpO2 95%  Physical Exam  Nursing note and vitals reviewed. Constitutional: He is oriented to person, place, and time. He appears well-developed and well-nourished.  HENT:  Head: Normocephalic and atraumatic.  Eyes: Conjunctivae are normal.  Neck: Normal range of motion.  Abdominal: Soft. There is no tenderness. There is no CVA tenderness.  Musculoskeletal: Normal range of motion. He exhibits no tenderness.       Right hip: Normal.       Left hip: Normal.       Cervical back: He exhibits no tenderness and no bony tenderness.       Thoracic back: He exhibits no tenderness and no bony tenderness.       Lumbar back: He exhibits tenderness. He exhibits no bony tenderness.       No step-off noted with palpation of spine.   Neurological: He is alert and oriented to person, place, and time. He has normal reflexes. No sensory deficit. He exhibits normal muscle tone.       5/5 strength in entire lower extremities bilaterally. No  sensation deficit.   Skin: Skin is warm and dry.  Psychiatric: He has a normal mood and affect.    ED Course  Procedures (including critical care time)  Labs Reviewed - No data to display No results found.   1. Back pain     5:41 PM Patient seen and examined. Medications ordered.   Vital signs reviewed and are as follows: Filed Vitals:   03/21/12 1549  BP: 143/77  Pulse: 86  Temp: 98.3 F (36.8 C)  Resp: 20   No red flag s/s of low back pain. Patient was counseled on back pain precautions and told to do activity as tolerated but do not lift, push, or pull heavy objects more than 10 pounds for the next week.  Patient counseled to use ice or heat on back for no longer than 15 minutes every hour.   Patient prescribed muscle relaxer and counseled on proper use of muscle relaxant medication.    Patient prescribed narcotic pain medicine and counseled on proper use of narcotic pain medications. Counseled not to combine this medication with others containing tylenol.   Urged patient not to drink alcohol, drive, or perform any other activities that requires focus while taking either of these medications.  Patient urged to follow-up with PCP if pain does not improve with treatment and rest or if pain becomes recurrent. Urged to return with worsening severe pain, loss of bowel or bladder control, trouble walking.   The patient verbalizes understanding and agrees with the plan.   MDM  Patient with back pain. History of same. No neurological deficits. Patient is ambulatory. No warning symptoms, other than age > 22 ,of back pain including: loss of bowel or bladder control, night sweats, waking from sleep with back pain, unexplained fevers or weight loss, h/o cancer, IVDU, recent trauma. No concern for cauda equina, epidural abscess, or other serious cause of back pain. Conservative measures such as rest, ice/heat and pain medicine indicated with PCP follow-up if no improvement with  conservative management.          Renne Crigler, [place] 03/21/12 [phone]

## 2012-03-21 NOTE — Discharge Instructions (Signed)
Please read and follow all provided instructions.  Your diagnoses today include:  1. Back pain     Tests performed today include:  Vital signs - see below for your results today  Medications prescribed:   Percocet (oxycodone/acetaminophen) - narcotic pain medication   You have been prescribed narcotic pain medication such as Vicodin or Percocet: DO NOT drive or perform any activities that require you to be awake and alert because this medicine can make you drowsy. BE VERY CAREFUL not to take multiple medicines containing Tylenol (also called acetaminophen). Doing so can lead to an overdose which can damage your liver and cause liver failure and possibly death.    Flexeril (cyclobenzaprine) - muscle relaxer medication  You have been prescribed a muscle relaxer medication such as Robaxin, Flexeril, or Valium: DO NOT drive or perform any activities that require you to be awake and alert because this medicine can make you drowsy.   You have been prescribed an anti-inflammatory medication or NSAID. Take with food. Take smallest effective dose for the shortest duration needed for your pain. Stop taking if you experience stomach pain or vomiting.   Take any prescribed medications only as directed.  Home care instructions:   Follow any educational materials contained in this packet  Please rest, use ice or heat on your back for the next several days  Do not lift, push, pull anything more than 10 pounds for the next week  Follow-up instructions: Please follow-up with your primary care provider in the next 3 days for further evaluation of your symptoms. If you do not have a primary care doctor -- see below for referral information.   Return instructions:  SEEK IMMEDIATE MEDICAL ATTENTION IF YOU HAVE:  New numbness, tingling, weakness, or problem with the use of your arms or legs  Severe back pain not relieved with medications  Loss control of your bowels or bladder  Increasing pain  in any areas of the body (such as chest or abdominal pain)  Shortness of breath, dizziness, or fainting.   Worsening nausea (feeling sick to your stomach), vomiting, fever, or sweats  Any other emergent concerns regarding your health   Additional Information:  Your vital signs today were: BP 143/77  Pulse 86  Temp(Src) 98.3 F (36.8 C) (Oral)  Resp 20  SpO2 95% If your blood pressure (BP) was elevated above 135/85 this visit, please have this repeated by your doctor within one month. -------------- No Primary Care Doctor Call [facility]  [phone] Other agencies that provide inexpensive medical care    [facility]  [phone]    [facility]  [phone]    [facility]  [phone]    [facility]  [phone]    [facility]  [phone]    [facility]  [phone] -------------- RESOURCE GUIDE:  Dental Problems  Patients with Medicaid: [facility] Dental [phone] [address]                                            [phone] Phone:  [phone]  Phone:  [phone]  If unable to pay or uninsured, contact:  [facility] or [facility]. to become qualified for the adult dental clinic.  Chronic Pain Problems Contact [facility]  [phone] Patients need to be referred by their primary care doctor.  Insufficient Money for Medicine Contact [employer]:  call "211" or [facility] [phone].  Psychological Services [facility]  [phone] [facility]  [phone] [facility] Mental [facility]   [phone] (emergency services [phone])  Substance Abuse Resources [facility]  [phone] [facility] [phone] [facility] [phone] Floydene Flock [phone] Residential & Outpatient Substance Abuse Program   [phone]  Abuse/Neglect [facility] Child Abuse Hotline [phone] [place] Child Abuse Hotline [phone] (After Hours)  Emergency Shelter [facility] [phone]  Maternity Homes [facility] at [facility] [phone] [facility] [phone]  [facility]  [facility]     [employer]                          [facility] Dept. [address] [place]                       [address]      [address]  Blondell Reveal Phone:  [phone]                                   Phone:  [phone]                 Phone:  [phone]  [facility] Mental [facility] Phone:  [phone]  [facility] Child Abuse Hotline [phone] (After Hours)

## 2012-03-21 NOTE — ED Notes (Signed)
States feels better.

## 2012-03-21 NOTE — ED Notes (Signed)
Patient reports that he has ongoing backpain x 4 days, hx of same. Reports that the pain is radiating down both legs. Pain worse with any change in position

## 2012-03-22 NOTE — ED Provider Notes (Signed)
Medical screening examination/treatment/procedure(s) were performed by non-physician practitioner and as supervising physician I was immediately available for consultation/collaboration. Devoria Albe, MD, Armando Gang   Ward Givens, MD 03/22/12 1040

## 2012-03-29 ENCOUNTER — Ambulatory Visit (HOSPITAL_COMMUNITY): Payer: Medicare Other | Admitting: Psychiatry

## 2012-03-29 ENCOUNTER — Encounter (HOSPITAL_COMMUNITY): Payer: Self-pay | Admitting: *Deleted

## 2012-03-29 ENCOUNTER — Emergency Department (HOSPITAL_COMMUNITY)
Admission: EM | Admit: 2012-03-29 | Discharge: 2012-03-29 | Disposition: A | Discharge status: Home / Self Care | Payer: Medicare Other | Attending: Emergency Medicine | Admitting: Emergency Medicine

## 2012-03-29 DIAGNOSIS — M549 Dorsalgia, unspecified: Secondary | ICD-10-CM | POA: Insufficient documentation

## 2012-03-29 DIAGNOSIS — K219 Gastro-esophageal reflux disease without esophagitis: Secondary | ICD-10-CM | POA: Insufficient documentation

## 2012-03-29 DIAGNOSIS — E7800 Pure hypercholesterolemia, unspecified: Secondary | ICD-10-CM | POA: Insufficient documentation

## 2012-03-29 DIAGNOSIS — M5430 Sciatica, unspecified side: Secondary | ICD-10-CM | POA: Insufficient documentation

## 2012-03-29 DIAGNOSIS — Z79899 Other long term (current) drug therapy: Secondary | ICD-10-CM | POA: Insufficient documentation

## 2012-03-29 DIAGNOSIS — R209 Unspecified disturbances of skin sensation: Secondary | ICD-10-CM | POA: Insufficient documentation

## 2012-03-29 HISTORY — DX: Gastro-esophageal reflux disease without esophagitis: K21.9

## 2012-03-29 HISTORY — DX: Pure hypercholesterolemia, unspecified: E78.00

## 2012-03-29 MED ORDER — HYDROMORPHONE HCL PF 1 MG/ML IJ SOLN
1.0000 mg | Freq: Once | INTRAMUSCULAR | Status: AC
  Administered 2012-03-29: 1 mg via INTRAMUSCULAR
  Filled 2012-03-29: qty 1

## 2012-03-29 MED ORDER — OXYCODONE-ACETAMINOPHEN 5-325 MG PO TABS: 1.0000 | ORAL_TABLET | Freq: Four times a day (QID) | ORAL | Status: AC | PRN

## 2012-03-29 MED ORDER — CYCLOBENZAPRINE HCL 10 MG PO TABS: 10.0000 mg | ORAL_TABLET | Freq: Two times a day (BID) | ORAL | Status: AC | PRN

## 2012-03-29 NOTE — Discharge Instructions (Signed)
Followup with orthopedics tomorrow.  Use conservative methods at home including heat therapy and cold therapy as we discussed. More information on cold therapy is listed below.  It is not recommended to use heat treatment directly after an acute injury.  SEEK IMMEDIATE MEDICAL ATTENTION IF: New numbness, tingling, weakness, or problem with the use of your arms or legs.  Severe back pain not relieved with medications.  Change in bowel or bladder control.  Increasing pain in any areas of the body (such as chest or abdominal pain).  Shortness of breath, dizziness or fainting.  Nausea (feeling sick to your stomach), vomiting, fever, or sweats.  COLD THERAPY DIRECTIONS:  Ice or gel packs can be used to reduce both pain and swelling. Ice is the most helpful within the first 24 to 48 hours after an injury or flareup from overusing a muscle or joint.  Ice is effective, has very few side effects, and is safe for most people to use.   If you expose your skin to cold temperatures for too long or without the proper protection, you can damage your skin or nerves. Watch for signs of skin damage due to cold.   HOME CARE INSTRUCTIONS  Follow these tips to use ice and cold packs safely.  Place a dry or damp towel between the ice and skin. A damp towel will cool the skin more quickly, so you may need to shorten the time that the ice is used.  For a more rapid response, add gentle compression to the ice.  Ice for no more than 10 to 20 minutes at a time. The bonier the area you are icing, the less time it will take to get the benefits of ice.  Check your skin after 5 minutes to make sure there are no signs of a poor response to cold or skin damage.  Rest 20 minutes or more in between uses.  Once your skin is numb, you can end your treatment. You can test numbness by very lightly touching your skin. The touch should be so light that you do not see the skin dimple from the pressure of your fingertip. When using ice,  most people will feel these normal sensations in this order: cold, burning, aching, and numbness.  Do not use ice on someone who cannot communicate their responses to pain, such as small children or people with dementia.   HOW TO MAKE AN ICE PACK  To make an ice pack, do one of the following:  Place crushed ice or a bag of frozen vegetables in a sealable plastic bag. Squeeze out the excess air. Place this bag inside another plastic bag. Slide the bag into a pillowcase or place a damp towel between your skin and the bag.  Mix 3 parts water with 1 part rubbing alcohol. Freeze the mixture in a sealable plastic bag. When you remove the mixture from the freezer, it will be slushy. Squeeze out the excess air. Place this bag inside another plastic bag. Slide the bag into a pillowcase or place a damp towel between your skin and the bag.   SEEK MEDICAL CARE IF:  You develop white spots on your skin. This may give the skin a blotchy (mottled) appearance.  Your skin turns blue or pale.  Your skin becomes waxy or hard.  Your swelling gets worse.  MAKE SURE YOU:  Understand these instructions.  Will watch your condition.  Will get help right away if you are not doing well or get  worse.    Chronic Pain Discharge Instructions  Emergency care providers appreciate that many patients coming to [place] are in severe pain and we wish to address their pain in the safest, most responsible manner.  It is important to recognize however, that the proper treatment of chronic pain differs from that of the pain of injuries and acute illnesses.  Our goal is to provide quality, safe, personalized care and we thank you for giving [place] the opportunity to serve you. The use of narcotics and related agents for chronic pain syndromes may lead to additional physical and psychological problems.  Nearly as many people die from prescription narcotics each year as die from car crashes.  Additionally, this risk is increased if such  prescriptions are obtained from a variety of sources.  Therefore, only your primary care physician or a pain management specialist is able to safely treat such syndromes with narcotic medications long-term.    Documentation revealing such prescriptions have been sought from multiple sources may prohibit [place] from providing a refill or different narcotic medication.  Your name may be checked first through the [facility] Controlled Substances Reporting System.  This database is a record of controlled substance medication prescriptions that the patient has received.  This has been established by [facility] in an effort to eliminate the dangerous, and often life threatening, practice of obtaining multiple prescriptions from different medical providers.   If you have a chronic pain syndrome (i.e. chronic headaches, recurrent back or neck pain, dental pain, abdominal or pelvis pain without a specific diagnosis, or neuropathic pain such as fibromyalgia) or recurrent visits for the same condition without an acute diagnosis, you may be treated with non-narcotics and other non-addictive medicines.  Allergic reactions or negative side effects that may be reported by a patient to such medications will not typically lead to the use of a narcotic analgesic or other controlled substance as an alternative.   Patients managing chronic pain with a personal physician should have provisions in place for breakthrough pain.  If you are in crisis, you should call your physician.  If your physician directs you to the emergency department, please have the doctor call and speak to our attending physician concerning your care.   When patients come to the Emergency Department (ED) with acute medical conditions in which the Emergency Department physician feels appropriate to prescribe narcotic or sedating pain medication, the physician will prescribe these in very limited quantities.  The amount of these medications will last only  until you can see your primary care physician in his/her office.  Any patient who returns to the ED seeking refills should expect only non-narcotic pain medications.   In the event of an acute medical condition exists and the emergency physician feels it is necessary that the patient be given a narcotic or sedating medication -  a responsible adult driver should be present in the room prior to the medication being given by the nurse.   Prescriptions for narcotic or sedating medications that have been lost, stolen or expired will not be refilled in the Emergency Department.    Patients who have chronic pain may receive non-narcotic prescriptions until seen by their primary care physician.  It is every patient's personal responsibility to maintain active prescriptions with his or her primary care physician or specialist.

## 2012-03-29 NOTE — ED Provider Notes (Signed)
History     CSN: [id number]  Arrival date & time 03/29/12  1714   First MD Initiated Contact with Patient 03/29/12 1954      Chief Complaint  Patient presents with  . Back Pain    (Consider location/radiation/quality/duration/timing/severity/associated sxs/prior treatment) HPI Comments: Patient with history of Sciatica comes in today with chronic back pain.  Pain no different today than pain in the past.  Pain located in lower back and radiates down his left leg.  No acute injury.  He reports that he has received injections for this pain in the past.  He has an appointment scheduled with [facility] tomorrow.  He was seen in the ED eight days ago for the same symptoms.  He reports that the medication that he was given at that time helped the pain, but he ran out.  Patient is a 56 y.o. male presenting with back pain. The history is provided by the patient.  Back Pain  This is a chronic problem. The problem occurs constantly. The problem has been gradually worsening. The pain is associated with no known injury. The pain is present in the lumbar spine. The quality of the pain is described as shooting. The pain radiates to the left foot. The symptoms are aggravated by bending and twisting. Associated symptoms include tingling. Pertinent negatives include no fever, no abdominal pain, no bowel incontinence, no perianal numbness, no bladder incontinence, no dysuria, no paresthesias and no paresis. He has tried nothing for the symptoms.    Past Medical History  Diagnosis Date  . Hypercholesteremia   . Acid reflux     History reviewed. No pertinent past surgical history.  No family history on file.  History  Substance Use Topics  . Smoking status: Never Smoker   . Smokeless tobacco: Not on file  . Alcohol Use: No      Review of Systems  Constitutional: Negative for fever and chills.  Respiratory: Negative for shortness of breath.   Gastrointestinal: Negative for nausea,  vomiting, abdominal pain and bowel incontinence.  Genitourinary: Negative for bladder incontinence, dysuria, hematuria, decreased urine volume and difficulty urinating.  Musculoskeletal: Positive for back pain. Negative for gait problem.  Skin: Negative for color change.  Neurological: Positive for tingling. Negative for paresthesias.    Allergies  Review of patient's allergies indicates no known allergies.  Home Medications   Current Outpatient Rx  Name Route Sig Dispense Refill  . ATORVASTATIN CALCIUM 40 MG PO TABS Oral Take 40 mg by mouth daily.     Marland Kitchen BENZTROPINE MESYLATE 0.5 MG PO TABS Oral Take 1 tablet (0.5 mg total) by mouth at bedtime. 30 tablet 1  . CYCLOBENZAPRINE HCL 10 MG PO TABS Oral Take 1 tablet (10 mg total) by mouth 2 (two) times daily as needed for muscle spasms. 14 tablet 0  . FLUTICASONE PROPIONATE 50 MCG/ACT NA SUSP Nasal Place 2 sprays into the nose daily.     Marland Kitchen NAPROXEN SODIUM 220 MG PO TABS Oral Take 440 mg by mouth 2 (two) times daily with a meal.    . NEXIUM 40 MG PO CPDR Oral Take 40 mg by mouth daily.     . OXYCODONE-ACETAMINOPHEN 5-325 MG PO TABS Oral Take 1-2 tablets by mouth every 6 (six) hours as needed for pain. 10 tablet 0  . TRAMADOL HCL 50 MG PO TABS Oral Take 50 mg by mouth every 6 (six) hours as needed.     . TRAZODONE HCL 150 MG PO TABS Oral  Take 1 tablet (150 mg total) by mouth at bedtime. 30 tablet 1    BP 146/85  Pulse 84  Temp(Src) 98.5 F (36.9 C) (Oral)  Resp 17  Wt 250 lb (113.399 kg)  SpO2 98%  Physical Exam  Nursing note and vitals reviewed. Constitutional: He is oriented to person, place, and time. He appears well-developed and well-nourished. No distress.  HENT:  Head: Normocephalic and atraumatic.  Eyes: Conjunctivae and EOM are normal. Pupils are equal, round, and reactive to light. No scleral icterus.  Neck: Normal range of motion and full passive range of motion without pain. Neck supple. No spinous process tenderness and no  muscular tenderness present. No rigidity. Normal range of motion present.  Cardiovascular: Normal rate, regular rhythm and intact distal pulses.  Exam reveals no gallop and no friction rub.   No murmur heard. Pulmonary/Chest: Effort normal and breath sounds normal. No respiratory distress. He has no wheezes. He has no rales. He exhibits no tenderness.  Musculoskeletal:       Cervical back: He exhibits normal range of motion, no tenderness, no bony tenderness and no pain.       Thoracic back: He exhibits no tenderness, no bony tenderness and no pain.       Lumbar back: He exhibits tenderness, bony tenderness and pain. He exhibits no spasm and normal pulse.       Bilateral lower extremities nontender without color change, baseline range of motion of extremities.  Pt has increased pain w ROM of lumbar spine. Pain w ambulation, no sign of ataxia.  Neurological: He is alert and oriented to person, place, and time. He has normal strength and normal reflexes. No sensory deficit. Gait (no ataxia, slowed and hunched d/t pain ) abnormal.       sensation at baseline for light touch in all 4 distal extremities, motor symmetric & bilateral 5/5  Abduction,adduction,flexion of hips, knee flexion & extension, foot dorsiflexion, foot plantar flexion.  Skin: Skin is warm and dry. No rash noted. He is not diaphoretic. No erythema. No pallor.  Psychiatric: He has a normal mood and affect.    ED Course  Procedures (including critical care time)  Labs Reviewed - No data to display No results found.   1. Sciatica       MDM  Patient with back pain.  No neurological deficits and normal neuro exam.  Patient can walk but states is painful.  No loss of bowel or bladder control.  No concern for cauda equina.  No fever, night sweats, weight loss, h/o cancer, IVDU.  RICE protocol and pain medicine indicated and discussed with patient.         Pascal Lux [place], PA-C 03/30/12 [phone]

## 2012-03-29 NOTE — ED Notes (Signed)
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

## 2012-03-30 NOTE — ED Provider Notes (Signed)
Medical screening examination/treatment/procedure(s) were performed by non-physician practitioner and as supervising physician I was immediately available for consultation/collaboration.  Dorothea Yow M Zooey Schreurs, MD 03/30/12 2116 

## 2012-04-12 ENCOUNTER — Ambulatory Visit (HOSPITAL_COMMUNITY): Payer: Medicare Other | Admitting: Psychiatry

## 2012-04-19 ENCOUNTER — Encounter (HOSPITAL_COMMUNITY): Payer: Self-pay | Admitting: Psychiatry

## 2012-04-19 ENCOUNTER — Ambulatory Visit (INDEPENDENT_AMBULATORY_CARE_PROVIDER_SITE_OTHER): Payer: Medicare Other | Admitting: Psychiatry

## 2012-04-19 VITALS — Wt 240.0 lb

## 2012-04-19 DIAGNOSIS — F209 Schizophrenia, unspecified: Secondary | ICD-10-CM

## 2012-04-19 NOTE — Progress Notes (Signed)
Chief complaint Medication management and followup.  History presenting illness Patient is a 56 year old [place] American male who came for his followup appointment.  Patient provide limited information about his psychiatric medication however he mentioned that he's been taking his medication .  His medicine is managed by his wife.  He told sometime he forgets taking his psychiatric a but most of the time he has been compliant.  He denies any recent paranoia or any hallucination.  She has been drinking or any illegal substance.  He sleeps fairly well.  Recently he denies any agitation anger or mood swings.  He denies any paranoia or any suicidal thinking.  Current psychiatric medication Risperdal 2 mg at bedtime  Trazodone 150 mg at bedtime Cogentin 0.5 mg at bedtime   Past psychiatric history Patient has been seen in this office since 2005.  He is a long history of psychiatric illness.  He has at least 3 psychiatric admission.  His last admission was in 2011.  He has at least 1 suicidal attempt by taking overdose on his medication.  He endorse history of psychosis paranoia and hallucination.  Medical history Patient has history of back pain, hyperlipidemia, and sees Dr. Felipa Eth.  He reported he has recent blood work including TSH which is normal.  Psychosocial history Patient was born and raised in [place].  He has 30 other siblings.  However some of them are died due to health reasons.  He's been married twice.  He is currently living with his wife is been very supportive.  Patient has 6 children.  He is a good support from the family.  Alcohol and substance use history Patient endorse no recent alcohol or any illegal substance use.  Mental status examination Patient is casually dressed and fairly groomed.  He maintained limited eye contact.  He has poverty of thought content.  His speech is slow but clear and coherent.  His thought process is also slowed but logical linear and  goal-directed.  There were no paranoia or delusions or any psychotic symptoms present at this time.  He has no tremors or shakes at this time.  His attention and concentration is fair.  He has no flight of idea or looseness of association.  He denies any active or passive suicidal thinking and homicidal thinking.  Denies any auditory or visual hallucination.  He's alert and oriented x3.  His insight judgment and impulse control is fair.  Assessment Axis I schizophrenia chronic paranoid type  Axis II deferred Axis III see medical history Axis IV mild Axis V 60-65   Plan I  called [facility] pharmacy .  He's been taking Risperdal Cogentin and trazodone however not getting refill regularly.  He still has one more refill remaining on his medication.  I explained risks and benefits of medication.  I also thought about chances of relapse if he is poorly compliant with medication.  At this time patient does not need any more refill since he has refill remaining at pharmacy.  I will continue these medication.  I will see him again in 2 months.  I encourage him to bring his wife next time on his followup appointment usually manages her medication refills.

## 2012-04-20 ENCOUNTER — Encounter (HOSPITAL_COMMUNITY): Payer: Self-pay

## 2012-04-20 ENCOUNTER — Emergency Department (HOSPITAL_COMMUNITY)
Admission: EM | Admit: 2012-04-20 | Discharge: 2012-04-20 | Disposition: A | Discharge status: Home / Self Care | Payer: Medicare Other | Attending: Emergency Medicine | Admitting: Emergency Medicine

## 2012-04-20 DIAGNOSIS — IMO0001 Reserved for inherently not codable concepts without codable children: Secondary | ICD-10-CM | POA: Insufficient documentation

## 2012-04-20 DIAGNOSIS — K219 Gastro-esophageal reflux disease without esophagitis: Secondary | ICD-10-CM | POA: Insufficient documentation

## 2012-04-20 DIAGNOSIS — Z79899 Other long term (current) drug therapy: Secondary | ICD-10-CM | POA: Insufficient documentation

## 2012-04-20 DIAGNOSIS — E7800 Pure hypercholesterolemia, unspecified: Secondary | ICD-10-CM | POA: Insufficient documentation

## 2012-04-20 DIAGNOSIS — R5381 Other malaise: Secondary | ICD-10-CM | POA: Insufficient documentation

## 2012-04-20 DIAGNOSIS — M545 Low back pain, unspecified: Secondary | ICD-10-CM | POA: Insufficient documentation

## 2012-04-20 DIAGNOSIS — M5432 Sciatica, left side: Secondary | ICD-10-CM

## 2012-04-20 DIAGNOSIS — M5430 Sciatica, unspecified side: Secondary | ICD-10-CM | POA: Insufficient documentation

## 2012-04-20 MED ORDER — HYDROMORPHONE HCL PF 2 MG/ML IJ SOLN
2.0000 mg | Freq: Once | INTRAMUSCULAR | Status: AC
  Administered 2012-04-20: 2 mg via INTRAVENOUS
  Filled 2012-04-20: qty 1

## 2012-04-20 MED ORDER — OXYCODONE-ACETAMINOPHEN 5-325 MG PO TABS: 2.0000 | ORAL_TABLET | Freq: Once | ORAL | Status: DC

## 2012-04-20 MED ORDER — ONDANSETRON 8 MG PO TBDP
8.0000 mg | ORAL_TABLET | Freq: Once | ORAL | Status: AC
  Administered 2012-04-20: 8 mg via ORAL
  Filled 2012-04-20: qty 1

## 2012-04-20 MED ORDER — OXYCODONE-ACETAMINOPHEN 5-325 MG PO TABS: 1.0000 | ORAL_TABLET | Freq: Four times a day (QID) | ORAL | Status: AC | PRN

## 2012-04-20 NOTE — ED Provider Notes (Signed)
History     CSN: [id number]  Arrival date & time 04/20/12  4098   First MD Initiated Contact with Patient 03/21/12 1724      Chief Complaint  Patient presents with  . Back Pain    lower back pain radaiitng to bilateral legs    (Consider location/radiation/quality/duration/timing/severity/associated sxs/prior treatment) HPI Comments: Patient with history of sciatica presents with left lower back pain shooting down into left buttock and leg. It worsened yesterday when picking up grass in his yard. Patient denies red flag signs and symptoms of lower back pain. He states that he sees Dr. Ethelene Hal for injections in his back. He has not needed them for approximately 3 years. Saw Dr. Darrelyn Hillock who was covering for Dr. Ethelene Hal, 3 weeks ago and was prescribed pain medication which he has taken. He has appointment for back injection May 10th. Home pain medication and muscle relaxers have not been controlling pain. Nothing makes the pain better.  Back Pain  This is a recurrent problem. The current episode started yesterday. The problem occurs constantly. The problem has not changed since onset.The pain is associated with lifting heavy objects. The pain is present in the lumbar spine. The quality of the pain is described as shooting. The pain radiates to the left thigh. Associated symptoms include weakness. Pertinent negatives include no fever, no numbness, no weight loss, no bowel incontinence, no perianal numbness, no bladder incontinence and no paresthesias. He has tried NSAIDs, analgesics and muscle relaxants for the symptoms. The treatment provided mild relief.  Patient is a 56 y.o. male presenting with back pain. The history is provided by the patient.    Past Medical History  Diagnosis Date  . Hypercholesteremia   . Acid reflux     History reviewed. No pertinent past surgical history.  Family History  Problem Relation Age of Onset  . Bipolar disorder Father   . Bipolar disorder Sister   .  Bipolar disorder Sister     History  Substance Use Topics  . Smoking status: Never Smoker   . Smokeless tobacco: Not on file  . Alcohol Use: No      Review of Systems  Constitutional: Negative for fever, weight loss and unexpected weight change.  Gastrointestinal: Negative for constipation and bowel incontinence.       Neg for fecal incontinence  Genitourinary: Negative for bladder incontinence, hematuria, flank pain and difficulty urinating.       Negative for urinary incontinence or retention  Musculoskeletal: Positive for back pain.  Neurological: Positive for weakness. Negative for numbness and paresthesias.       Negative for saddle paresthesias     Allergies  Review of patient's allergies indicates no known allergies.  Home Medications   Current Outpatient Rx  Name Route Sig Dispense Refill  . ATORVASTATIN CALCIUM 40 MG PO TABS Oral Take 40 mg by mouth daily.     Marland Kitchen BENZTROPINE MESYLATE 0.5 MG PO TABS Oral Take 1 tablet (0.5 mg total) by mouth at bedtime. 30 tablet 1  . FLUTICASONE PROPIONATE 50 MCG/ACT NA SUSP Nasal Place 2 sprays into the nose daily.     Marland Kitchen NAPROXEN SODIUM 220 MG PO TABS Oral Take 440 mg by mouth 2 (two) times daily with a meal.    . NEXIUM 40 MG PO CPDR Oral Take 40 mg by mouth daily.     . OXYCODONE-ACETAMINOPHEN 10-325 MG PO TABS Oral Take 1 tablet by mouth every 6 (six) hours as needed.    Marland Kitchen  RISPERIDONE 2 MG PO TABS Oral Take 2 mg by mouth daily.    . TRAMADOL HCL 50 MG PO TABS Oral Take 50 mg by mouth every 6 (six) hours as needed.     . TRAZODONE HCL 150 MG PO TABS Oral Take 1 tablet (150 mg total) by mouth at bedtime. 30 tablet 1  . OXYCODONE-ACETAMINOPHEN 5-325 MG PO TABS Oral Take 1-2 tablets by mouth every 6 (six) hours as needed for pain. 20 tablet 0    BP 148/81  Pulse 80  Temp(Src) 97.9 F (36.6 C) (Oral)  Resp 16  SpO2 100%  Physical Exam  Nursing note and vitals reviewed. Constitutional: He is oriented to person, place, and  time. He appears well-developed and well-nourished.  HENT:  Head: Normocephalic and atraumatic.  Eyes: Conjunctivae are normal.  Neck: Normal range of motion.  Abdominal: Soft. There is no tenderness. There is no CVA tenderness.  Musculoskeletal: Normal range of motion. He exhibits no tenderness.       Right hip: Normal.       Left hip: Normal.       Cervical back: He exhibits no tenderness and no bony tenderness.       Thoracic back: He exhibits no tenderness and no bony tenderness.       Lumbar back: He exhibits tenderness (left sided). He exhibits no bony tenderness.       No step-off noted with palpation of spine.   Neurological: He is alert and oriented to person, place, and time. He has normal reflexes. No sensory deficit. He exhibits normal muscle tone.       Slightly diminished strength left lower extremity, otherwise 5/5 strength in right lower extremity. No sensation deficit.   Skin: Skin is warm and dry.  Psychiatric: He has a normal mood and affect.    ED Course  Procedures  (including critical care time)  Labs Reviewed - No data to display No results found.   1. Sciatica of left side     9:06 AM Patient seen and examined. Medications ordered.   Vital signs reviewed and are as follows: Filed Vitals:   04/20/12 0842  BP: 148/81  Pulse: 80  Temp: 97.9 F (36.6 C)  Resp: 16   No red flag s/s of low back pain. Patient was counseled on back pain precautions and told to do activity as tolerated but do not lift, push, or pull heavy objects more than 10 pounds for the next week.  Patient counseled to use ice or heat on back for no longer than 15 minutes every hour.    Patient prescribed narcotic pain medicine and counseled on proper use of narcotic pain medications. Counseled not to combine this medication with others containing tylenol.   Urged patient not to drink alcohol, drive, or perform any other activities that requires focus while taking either of these  medications.  Patient urged to follow-up with PCP if pain does not improve with treatment and rest or if pain becomes recurrent. Urged to return with worsening severe pain, loss of bowel or bladder control, trouble walking.   The patient verbalizes understanding and agrees with the plan.   MDM  Patient with back pain. History of same. Unchanged from previous. No neurological deficits. Patient is ambulatory. No warning symptoms, other than age > 14 ,of back pain including: loss of bowel or bladder control, night sweats, waking from sleep with back pain, unexplained fevers or weight loss, h/o cancer, IVDU, recent trauma. No concern  for cauda equina, epidural abscess, or other serious cause of back pain. Conservative measures such as rest, ice/heat and pain medicine indicated with PCP follow-up if no improvement with conservative management.         [place], [place] 04/20/12 [phone]

## 2012-04-20 NOTE — Discharge Instructions (Signed)
Please read and follow all provided instructions.  Your diagnoses today include:  1. Sciatica of left side     Tests performed today include:  Vital signs - see below for your results today  Medications prescribed:   Percocet (oxycodone/acetaminophen) - narcotic pain medication   You have been prescribed narcotic pain medication such as Vicodin or Percocet: DO NOT drive or perform any activities that require you to be awake and alert because this medicine can make you drowsy. BE VERY CAREFUL not to take multiple medicines containing Tylenol (also called acetaminophen). Doing so can lead to an overdose which can damage your liver and cause liver failure and possibly death.   Take any prescribed medications only as directed.  Home care instructions:   Follow any educational materials contained in this packet  Please rest, use ice or heat on your back for the next several days  Do not lift, push, pull anything more than 10 pounds for the next week  Follow-up instructions: Please follow-up with your primary care provider in the next 1 week for further evaluation of your symptoms.   Keep your appointment with Dr. Ethelene Hal in May.   If you do not have a primary care doctor -- see below for referral information.   Return instructions:  SEEK IMMEDIATE MEDICAL ATTENTION IF YOU HAVE:  New numbness, tingling, weakness, or problem with the use of your arms or legs  Severe back pain not relieved with medications  Loss control of your bowels or bladder  Increasing pain in any areas of the body (such as chest or abdominal pain)  Shortness of breath, dizziness, or fainting.   Worsening nausea (feeling sick to your stomach), vomiting, fever, or sweats  Any other emergent concerns regarding your health   Additional Information:  Your vital signs today were: BP 148/81  Pulse 80  Temp(Src) 97.9 F (36.6 C) (Oral)  Resp 16  SpO2 100% If your blood pressure (BP) was elevated above  135/85 this visit, please have this repeated by your doctor within one month. -------------- No Primary Care Doctor Call [facility]  [phone] Other agencies that provide inexpensive medical care    [facility]  [phone]    [facility]  [phone]    [facility]  [phone]    [facility]  [phone]    [facility]  [phone]    [facility]  [phone] -------------- RESOURCE GUIDE:  Dental Problems  Patients with Medicaid: [facility] [phone] [address]                                            [phone] [address] Phone:  [phone]                                                   Phone:  [phone]  If unable to pay or uninsured, contact:  [facility] or [facility] to become qualified for the adult dental clinic.  Chronic Pain Problems Contact [facility]  [phone] Patients need to be referred  by their primary care doctor.  Insufficient Money for Medicine Contact [employer]:  call "211" or [facility] [phone].  Psychological Services [facility]  [phone] [facility]  [phone] [facility]   [phone] (emergency services [phone])  Substance Abuse Resources [facility]  [phone] [facility] [phone] [facility] [phone] Floydene Flock [phone] Residential & Outpatient Substance Abuse Program  [phone]  Abuse/Neglect [facility] Child Abuse Hotline 540-332-2162 [facility] Child Abuse Hotline [phone] (After Hours)  Emergency Shelter [facility] [phone]  Maternity Homes [facility] at the [place] [facility] [phone] [facility] [phone]  [facility] Resources  [facility]     [employer]                          [facility] Dept. [address]                        [address]      [address]  Blondell Reveal Phone:  [phone]                                   Phone:  [phone]                 Phone:  [phone]  [facility] Phone:  [phone]  [facility] Child Abuse Hotline [phone] (After Hours)

## 2012-04-20 NOTE — ED Notes (Signed)
Pt in from home with c/o lower back pain radiating to bilateral legs pt states a hx of chronic back pain states pain has worsened last night  Denies a recent injury

## 2012-04-20 NOTE — ED Notes (Signed)
Pt still not in room and assumed to have left. Pt given d/c papers but had not signed out. Pt left before signing out.

## 2012-04-21 NOTE — ED Provider Notes (Signed)
Medical screening examination/treatment/procedure(s) were performed by non-physician practitioner and as supervising physician I was immediately available for consultation/collaboration.   Laray Anger, DO 04/21/12 2025

## 2012-05-24 ENCOUNTER — Emergency Department (HOSPITAL_COMMUNITY)
Admission: EM | Admit: 2012-05-24 | Discharge: 2012-05-24 | Disposition: A | Discharge status: Home / Self Care | Payer: Medicare Other | Attending: Emergency Medicine | Admitting: Emergency Medicine

## 2012-05-24 ENCOUNTER — Encounter (HOSPITAL_COMMUNITY): Payer: Self-pay | Admitting: *Deleted

## 2012-05-24 DIAGNOSIS — E7800 Pure hypercholesterolemia, unspecified: Secondary | ICD-10-CM | POA: Insufficient documentation

## 2012-05-24 DIAGNOSIS — K219 Gastro-esophageal reflux disease without esophagitis: Secondary | ICD-10-CM | POA: Insufficient documentation

## 2012-05-24 DIAGNOSIS — G43909 Migraine, unspecified, not intractable, without status migrainosus: Secondary | ICD-10-CM | POA: Insufficient documentation

## 2012-05-24 HISTORY — DX: Headache: R51

## 2012-05-24 HISTORY — DX: Headache, unspecified: R51.9

## 2012-05-24 MED ORDER — TRAMADOL HCL 50 MG PO TABS: 50.0000 mg | ORAL_TABLET | Freq: Four times a day (QID) | ORAL | Status: DC | PRN

## 2012-05-24 MED ORDER — HYDROMORPHONE HCL PF 2 MG/ML IJ SOLN
2.0000 mg | Freq: Once | INTRAMUSCULAR | Status: AC
  Administered 2012-05-24: 2 mg via INTRAMUSCULAR
  Filled 2012-05-24: qty 1

## 2012-05-24 NOTE — ED Provider Notes (Signed)
History     CSN: [id number]  Arrival date & time 05/24/12  1544   First MD Initiated Contact with Patient 05/24/12 1629      Chief Complaint  Patient presents with  . Migraine    (Consider location/radiation/quality/duration/timing/severity/associated sxs/prior treatment) Patient is a 56 y.o. male presenting with migraine. The history is provided by the patient.  Migraine This is a recurrent problem. The current episode started in the past 7 days. The problem occurs constantly. The problem has been gradually worsening. Associated symptoms include headaches and nausea. Pertinent negatives include no abdominal pain, congestion, fever, neck pain or vomiting. Associated symptoms comments: History of migraines since being in active [employer]. He is usually managed by primary care and states Ultram usually works, however, no relief. N without V, no fever, photophobia. He reports symptoms typical for his headache..    Past Medical History  Diagnosis Date  . Hypercholesteremia   . Acid reflux   . Head ache     Past Surgical History  Procedure Date  . Tonsillectomy     Family History  Problem Relation Age of Onset  . Bipolar disorder Father   . Bipolar disorder Sister   . Bipolar disorder Sister     History  Substance Use Topics  . Smoking status: Never Smoker   . Smokeless tobacco: Not on file  . Alcohol Use: No      Review of Systems  Constitutional: Negative for fever.  HENT: Negative for congestion and neck pain.   Eyes: Positive for photophobia. Negative for discharge and visual disturbance.  Gastrointestinal: Positive for nausea. Negative for vomiting and abdominal pain.  Neurological: Positive for headaches.    Allergies  Review of patient's allergies indicates no known allergies.  Home Medications   Current Outpatient Rx  Name Route Sig Dispense Refill  . ATORVASTATIN CALCIUM 40 MG PO TABS Oral Take 40 mg by mouth daily.     Marland Kitchen BENZTROPINE MESYLATE 0.5 MG  PO TABS Oral Take 1 tablet (0.5 mg total) by mouth at bedtime. 30 tablet 1  . FLUTICASONE PROPIONATE 50 MCG/ACT NA SUSP Nasal Place 2 sprays into the nose daily.     Marland Kitchen NEXIUM 40 MG PO CPDR Oral Take 40 mg by mouth daily.     . OXYCODONE-ACETAMINOPHEN 10-325 MG PO TABS Oral Take 1 tablet by mouth every 6 (six) hours as needed. PAIN    . TRAMADOL HCL 50 MG PO TABS Oral Take 50 mg by mouth every 6 (six) hours as needed. PAIN      BP 150/84  Pulse 75  Temp(Src) 98 F (36.7 C) (Oral)  Resp 16  SpO2 95%  Physical Exam  Constitutional: He is oriented to person, place, and time. He appears well-developed and well-nourished.  HENT:  Head: Normocephalic and atraumatic.  Eyes: Conjunctivae and EOM are normal. Pupils are equal, round, and reactive to light.  Neck: Normal range of motion.  Cardiovascular: Normal rate and regular rhythm.   No murmur heard. Pulmonary/Chest: Effort normal and breath sounds normal. He has no wheezes. He has no rales.  Abdominal: Soft. There is no tenderness.  Musculoskeletal: Normal range of motion.  Neurological: He is alert and oriented to person, place, and time. He has normal strength and normal reflexes. He displays normal reflexes. No sensory deficit. He displays a negative Romberg sign. Coordination normal.  Skin: Skin is warm and dry.  Psychiatric: He has a normal mood and affect.    ED Course  Procedures (including  critical care time)  Labs Reviewed - No data to display No results found.   No diagnosis found.  1. Migraine   MDM  Pain treated and Ultram renewed. He will follow up with PCP in 1-2 days for recheck.        Rodena Medin, PA-C 05/24/12 1651

## 2012-05-24 NOTE — Discharge Instructions (Signed)
FOLLOW UP WITH YOUR DOCTOR FOR RECHECK IN 1-2 DAYS. TAKE ULTRAM FOR PAIN AS DIRECTED.  Migraine Headache A migraine is very bad pain on one or both sides of your head. The cause of a migraine is not always known. A migraine can be triggered or caused by different things, such as:  Alcohol.   Smoking.   Stress.   Periods (menstruation) in women.   Aged cheeses.   Foods or drinks that contain nitrates, glutamate, aspartame, or tyramine.   Lack of sleep.   Chocolate.   Caffeine.   Hunger.   Medicines, such as nitroglycerine (used to treat chest pain), birth control pills, estrogen, and some blood pressure medicines.  HOME CARE  Many medicines can help migraine pain or keep migraines from coming back. Your doctor can help you decide on a medicine or treatment program.   If you or your child gets a migraine, it may help to lie down in a dark, quiet room.   Keep a headache journal. This may help find out what is causing the headaches. For example, write down:   What you eat and drink.   How much sleep you get.   Any change to your diet or medicines.  GET HELP RIGHT AWAY IF:   The medicine does not work.   The pain begins again.   The neck is stiff.   You have trouble seeing.   The muscles are weak or you lose muscle control.   You have new symptoms.   You lose your balance.   You have trouble walking.   You feel faint or pass out.  MAKE SURE YOU:   Understand these instructions.   Will watch this condition.   Will get help right away if you are not doing well or get worse.  Document Released: 09/23/2008 Document Revised: 12/04/2011 Document Reviewed: 08/20/2009 [facility] Patient Information 2012 [place], [place].

## 2012-05-24 NOTE — ED Notes (Signed)
Has history of headaches, ate some chocolate and has made the headache worse. States he usually receives a narcotic then the Tramadol will take care of it.

## 2012-05-25 NOTE — ED Provider Notes (Signed)
Medical screening examination/treatment/procedure(s) were performed by non-physician practitioner and as supervising physician I was immediately available for consultation/collaboration.  Corona Popovich T Carri Spillers, MD 05/25/12 2231 

## 2012-06-21 ENCOUNTER — Ambulatory Visit (INDEPENDENT_AMBULATORY_CARE_PROVIDER_SITE_OTHER): Payer: Medicare Other | Admitting: Psychiatry

## 2012-06-21 ENCOUNTER — Encounter (HOSPITAL_COMMUNITY): Payer: Self-pay | Admitting: Psychiatry

## 2012-06-21 DIAGNOSIS — F200 Paranoid schizophrenia: Secondary | ICD-10-CM

## 2012-06-21 NOTE — Progress Notes (Signed)
Chief complaint Medication management and followup.  History presenting illness Patient is a 56 year old [place] male who came for his followup appointment.  On his last visit he was recommend to have his wife come on his appointment or bring his medication list however patient did not bring his medication list and came by himself since wife was busy .  He admitted taking all his medication including Risperdal trazodone and Cogentin.  However he do not remember other medication .  He was given injection for his pain but he do not remember the name.  He scheduled to have another injection next week .  Patient told despite getting injection for his pain he continues to have back pain .  He is seeing Dr. Ethelene Hal for his back pain.  His primary care physician is Dr., Virgel Manifold and he'll see his primary care physician in October for his blood work.  Overall his paranoia has been stable.  He denies any agitation anger mood swing.  He denies any side effects of medication.  He admits some sleep issue due to back pain but there has been no hallucination or aggression.  He has any tremors or side effects.  He's not drinking or using any illegal substance.  Current psychiatric medication Risperdal 2 mg at bedtime  Trazodone 150 mg at bedtime Cogentin 0.5 mg at bedtime   Past psychiatric history Patient has been seen in this office since 2005.  He is a long history of psychiatric illness.  He has at least 3 psychiatric admission.  His last admission was in 2011.  He has at least 1 suicidal attempt by taking overdose on his medication.  He endorse history of psychosis paranoia and hallucination.  Medical history Patient has history of back pain, hyperlipidemia, and sees Dr. Felipa Eth.  He has been getting injection for his back pain .  He scheduled to see his primary care physician in October for his blood work.    Psychosocial history Patient was born and raised in [place].  He has 30 other siblings.   However some of them are died due to health reasons.  He's been married twice.  He is currently living with his wife is been very supportive.  Patient has 6 children.  He is a good support from the family.  Alcohol and substance use history Patient endorse no recent alcohol or any illegal substance use.  Mental status examination Patient is casually dressed and fairly groomed.  He maintained limited eye contact.  He has poverty of thought content.  His speech is slow but clear and coherent.  His thought process is also slowed but logical linear and goal-directed.  There were no paranoia or delusions or any psychotic symptoms present at this time.  He has no tremors or shakes at this time.  His attention and concentration is fair.  He has no flight of idea or looseness of association.  He denies any active or passive suicidal thinking and homicidal thinking.  Denies any auditory or visual hallucination.  He's alert and oriented x3.  His insight judgment and impulse control is fair.  Assessment Axis I schizophrenia chronic paranoid type  Axis II deferred Axis III see medical history Axis IV mild Axis V 60-65   Plan I will continue his current psychiatric medication.  She told he has still 2 more refills on her psychiatric medication.  He does not need any new refill.  However he to call us if he needed.  One more time  I encouraged him to bring his wife for his next appointment or bring the recent update medication list.  I explained the risk and benefits of medication and recommended a call us if he has any question or concern about the medication or if he feel worsening of the symptoms.  I will see him again in 2 months.  Portion of this note is generated with voice recognition software and may contain typographical error.

## 2012-08-16 ENCOUNTER — Ambulatory Visit (HOSPITAL_COMMUNITY): Payer: Self-pay | Admitting: Psychiatry

## 2012-08-25 ENCOUNTER — Ambulatory Visit (INDEPENDENT_AMBULATORY_CARE_PROVIDER_SITE_OTHER): Payer: Medicare Other | Admitting: Psychiatry

## 2012-08-25 DIAGNOSIS — F200 Paranoid schizophrenia: Secondary | ICD-10-CM

## 2012-08-25 MED ORDER — RISPERIDONE 2 MG PO TABS: 2.0000 mg | ORAL_TABLET | Freq: Every day | ORAL | Status: DC

## 2012-08-25 MED ORDER — BENZTROPINE MESYLATE 0.5 MG PO TABS: 0.5000 mg | ORAL_TABLET | Freq: Every day | ORAL | Status: DC

## 2012-08-25 MED ORDER — TRAZODONE HCL 100 MG PO TABS: 150.0000 mg | ORAL_TABLET | Freq: Every day | ORAL | Status: AC

## 2012-08-25 NOTE — Progress Notes (Signed)
Chief complaint Medication management and followup.  History presenting illness Patient is a 56 year old [place] male who came for his followup appointment.  Patient is compliant with her psychiatric medication.  He is a bit family reunion few weeks ago.  He had a good time at family reunion.  He is sleeping better.  He still has some paranoia but he denies any recent hallucination agitation anger mood swing.  Denies any tremors or shakes.  He does not remember his medication refills. I called pharmacy and verify his medication .  He does not have any more refills on her psychotropic medication.  Patient admitted that he has changed his pharmacy few times .  He did not bring list of medication with him.  However he endorse that he is taking his medication regularly.  He takes pain medication prescriber Dr. Modesta Messing 4 back pain. His primary care physician is Dr., Virgel Manifold and he'll see his primary care physician in October for his blood work.  He's not drinking or using any illegal substance.  Current psychiatric medication Risperdal 2 mg at bedtime  Trazodone 150 mg at bedtime Cogentin 0.5 mg at bedtime   Past psychiatric history Patient has been seen in this office since 2005.  He is a long history of psychiatric illness.  He has at least 3 psychiatric admission.  His last admission was in 2011.  He has at least 1 suicidal attempt by taking overdose on his medication.  He endorse history of psychosis paranoia and hallucination.  Medical history Patient has history of back pain, hyperlipidemia, and sees Dr. Felipa Eth.  He has been getting injection for his back pain .  He scheduled to see his primary care physician in October for his blood work.    Psychosocial history Patient was born and raised in [place].  He has 2 living siblings.  He's been married twice.  He is currently living with his wife is been very supportive.  Patient has 6 children.  He is a good support from the  family.  Alcohol and substance use history Patient endorse no recent alcohol or any illegal substance use.  Mental status examination Patient is casually dressed and fairly groomed.  He maintained limited eye contact.  He has poverty of thought content.  His speech is slow but clear and coherent.  His thought process is also slowed but logical linear and goal-directed.  There were no paranoia or delusions or any psychotic symptoms present at this time.  He has no tremors or shakes at this time.  His attention and concentration is fair.  He has no flight of idea or looseness of association.  He denies any active or passive suicidal thinking and homicidal thinking.  Denies any auditory or visual hallucination.  He's alert and oriented x3.  His insight judgment and impulse control is fair.  Assessment Axis I schizophrenia chronic paranoid type  Axis II deferred Axis III see medical history Axis IV mild Axis V 60-65   Plan I will continue his current psychiatric medication.  I recommend to call us if he has any question or concern about the medication or if he feel worsening of the symptoms.  I will see him again in 2 months.  Portion of this note is generated with voice recognition software and may contain typographical error.

## 2012-11-29 ENCOUNTER — Ambulatory Visit (HOSPITAL_COMMUNITY): Payer: Medicare Other | Admitting: Psychiatry

## 2012-12-08 ENCOUNTER — Ambulatory Visit (INDEPENDENT_AMBULATORY_CARE_PROVIDER_SITE_OTHER): Payer: Medicare Other | Admitting: Psychiatry

## 2012-12-08 ENCOUNTER — Encounter (HOSPITAL_COMMUNITY): Payer: Self-pay | Admitting: Psychiatry

## 2012-12-08 VITALS — BP 122/70 | HR 88 | Wt 240.0 lb

## 2012-12-08 DIAGNOSIS — F200 Paranoid schizophrenia: Secondary | ICD-10-CM

## 2012-12-08 MED ORDER — BENZTROPINE MESYLATE 0.5 MG PO TABS: 0.5000 mg | ORAL_TABLET | Freq: Every day | ORAL | Status: DC

## 2012-12-08 NOTE — Progress Notes (Signed)
Patient ID: Roger Lopez, male   DOB: 1956/04/02, 56 y.o.   MRN: [id number] Chief complaint Medication management and followup.  History presenting illness Patient is a 56 year old [place] male who came for his followup appointment.  Patient is compliant with her psychiatric medication.  He has remaining refills on his Risperdal and trazodone.  He is sleeping better and denies any side effects.  He had a good Thanksgiving.  He has no plan for Christmas but he is expecting family member to visit him.  He denies any recent paranoia agitation anger mood swing.  He denies any tremors or shakes.  He likes Cogentin at bedtime which is helping his leg cramp.  He is taking pain medication from Dr. Modesta Messing.  He's not drinking or using any illegal substance.  Current psychiatric medication Risperdal 2 mg at bedtime  Trazodone 150 mg at bedtime Cogentin 0.5 mg at bedtime   Past psychiatric history Patient has been seen in this office since 2005.  He is a long history of psychiatric illness.  He has at least 3 psychiatric admission.  His last admission was in 2011.  He has at least 1 suicidal attempt by taking overdose on his medication.  He endorse history of psychosis paranoia and hallucination.  Medical history Patient has history of back pain, hyperlipidemia, and sees Dr. Felipa Eth.  He has been getting injection for his back pain .  He scheduled to see his primary care physician in October for his blood work.    Psychosocial history Patient was born and raised in [place].  He has 2 living siblings.  He's been married twice.  He is currently living with his wife is been very supportive.  Patient has 6 children.  He is a good support from the family.  Alcohol and substance use history Patient endorse no recent alcohol or any illegal substance use.  Review of Systems  Musculoskeletal: Positive for back pain and joint pain.  Psychiatric/Behavioral: Negative for depression, suicidal ideas,  hallucinations, memory loss and substance abuse. The patient is nervous/anxious and has insomnia.    Mental status examination Patient is casually dressed and fairly groomed.  He maintained fair eye contact.  His speech is slow but clear and coherent.  His thought process is also slowed but logical linear and goal-directed.  There were no paranoia or delusions or any psychotic symptoms present at this time.  He has no tremors or shakes at this time.  His attention and concentration is fair.  He has no flight of idea or looseness of association.  He denies any active or passive suicidal thinking and homicidal thinking.  Denies any auditory or visual hallucination.  He's alert and oriented x3.  His insight judgment and impulse control is fair.  Assessment Axis I schizophrenia chronic paranoid type  Axis II deferred Axis III see medical history Axis IV mild Axis V 60-65   Plan I will continue his current psychiatric medication.  I recommend to call us if he has any question or concern about the medication or if he feel worsening of the symptoms.  I will see him again in 3 months.  I will give him Cogentin but patient has refill remaining on his trazodone and Risperdal.  Portion of this note is generated with voice recognition software and may contain typographical error.

## 2013-02-21 ENCOUNTER — Other Ambulatory Visit (HOSPITAL_COMMUNITY): Payer: Self-pay | Admitting: Psychiatry

## 2013-03-01 ENCOUNTER — Emergency Department (HOSPITAL_COMMUNITY): Payer: Medicare Other

## 2013-03-01 ENCOUNTER — Encounter (HOSPITAL_COMMUNITY): Payer: Self-pay | Admitting: *Deleted

## 2013-03-01 ENCOUNTER — Emergency Department (HOSPITAL_COMMUNITY)
Admission: EM | Admit: 2013-03-01 | Discharge: 2013-03-01 | Disposition: A | Discharge status: Home / Self Care | Payer: Medicare Other | Attending: Emergency Medicine | Admitting: Emergency Medicine

## 2013-03-01 DIAGNOSIS — K219 Gastro-esophageal reflux disease without esophagitis: Secondary | ICD-10-CM | POA: Insufficient documentation

## 2013-03-01 DIAGNOSIS — M5432 Sciatica, left side: Secondary | ICD-10-CM

## 2013-03-01 DIAGNOSIS — M79609 Pain in unspecified limb: Secondary | ICD-10-CM | POA: Insufficient documentation

## 2013-03-01 DIAGNOSIS — G8929 Other chronic pain: Secondary | ICD-10-CM | POA: Insufficient documentation

## 2013-03-01 DIAGNOSIS — Z8679 Personal history of other diseases of the circulatory system: Secondary | ICD-10-CM | POA: Insufficient documentation

## 2013-03-01 DIAGNOSIS — E7800 Pure hypercholesterolemia, unspecified: Secondary | ICD-10-CM | POA: Insufficient documentation

## 2013-03-01 DIAGNOSIS — M5430 Sciatica, unspecified side: Secondary | ICD-10-CM | POA: Insufficient documentation

## 2013-03-01 DIAGNOSIS — Z79899 Other long term (current) drug therapy: Secondary | ICD-10-CM | POA: Insufficient documentation

## 2013-03-01 MED ORDER — IBUPROFEN 600 MG PO TABS: 600.0000 mg | ORAL_TABLET | Freq: Four times a day (QID) | ORAL | Status: DC | PRN

## 2013-03-01 MED ORDER — HYDROMORPHONE HCL PF 2 MG/ML IJ SOLN
2.0000 mg | Freq: Once | INTRAMUSCULAR | Status: AC
  Administered 2013-03-01: 2 mg via INTRAVENOUS
  Filled 2013-03-01: qty 1

## 2013-03-01 MED ORDER — ONDANSETRON 4 MG PO TBDP
4.0000 mg | ORAL_TABLET | Freq: Once | ORAL | Status: AC
  Administered 2013-03-01: 4 mg via ORAL
  Filled 2013-03-01: qty 1

## 2013-03-01 MED ORDER — OXYCODONE-ACETAMINOPHEN 5-325 MG PO TABS: 1.0000 | ORAL_TABLET | Freq: Four times a day (QID) | ORAL | Status: DC | PRN

## 2013-03-01 MED ORDER — CYCLOBENZAPRINE HCL 5 MG PO TABS: 5.0000 mg | ORAL_TABLET | Freq: Three times a day (TID) | ORAL | Status: DC | PRN

## 2013-03-01 NOTE — ED Notes (Signed)
Pt reports chronic lower back pain. Waiting to hear from Dr. Ethelene Hal, Ortho to schedule "injection" for pain. Reports pain has worsened over last few days.

## 2013-03-01 NOTE — ED Provider Notes (Signed)
History     CSN: [id number]  Arrival date & time 03/01/13  1329   First MD Initiated Contact with Patient 03/01/13 1417      Chief Complaint  Patient presents with  . Back Pain    (Consider location/radiation/quality/duration/timing/severity/associated sxs/prior treatment) HPI Pt presenting with c/o worsening of his lower back pain.  Pain radiates down his left leg.  He has had similar pain for years.  No new trauma.  Pain is worse with movement and certain positions.  No fever.  No difficulty urinating, no loss of control of bowel or bladder, no weakness of legs.  He is trying to arrange for an appointment with Dr. Ethelene Hal.  There are no other associated systemic symptoms, there are no other alleviating or modifying factors.   Past Medical History  Diagnosis Date  . Hypercholesteremia   . Acid reflux   . Head ache   . Back pain     Past Surgical History  Procedure Laterality Date  . Tonsillectomy      Family History  Problem Relation Age of Onset  . Bipolar disorder Father   . Bipolar disorder Sister   . Bipolar disorder Sister     History  Substance Use Topics  . Smoking status: Never Smoker   . Smokeless tobacco: Not on file  . Alcohol Use: No      Review of Systems ROS reviewed and all otherwise negative except for mentioned in HPI  Allergies  Review of patient's allergies indicates no known allergies.  Home Medications   Current Outpatient Rx  Name  Route  Sig  Dispense  Refill  . atorvastatin (LIPITOR) 40 MG tablet   Oral   Take 40 mg by mouth daily.          . fluticasone (FLONASE) 50 MCG/ACT nasal spray   Nasal   Place 2 sprays into the nose daily.          Marland Kitchen NEXIUM 40 MG capsule   Oral   Take 40 mg by mouth daily.          . risperiDONE (RISPERDAL) 2 MG tablet      take 1 tablet by mouth at bedtime   30 tablet   1   . traMADol (ULTRAM) 50 MG tablet   Oral   Take 1 tablet (50 mg total) by mouth every 6 (six) hours as needed.  PAIN   20 tablet   0   . traZODone (DESYREL) 150 MG tablet      take 1 tablet by mouth at bedtime   30 tablet   1   . benztropine (COGENTIN) 0.5 MG tablet   Oral   Take 1 tablet (0.5 mg total) by mouth at bedtime.   30 tablet   1   . cyclobenzaprine (FLEXERIL) 5 MG tablet   Oral   Take 1 tablet (5 mg total) by mouth 3 (three) times daily as needed for muscle spasms.   20 tablet   0   . ibuprofen (ADVIL,MOTRIN) 600 MG tablet   Oral   Take 1 tablet (600 mg total) by mouth every 6 (six) hours as needed for pain.   30 tablet   0   . oxyCODONE-acetaminophen (PERCOCET/ROXICET) 5-325 MG per tablet   Oral   Take 1-2 tablets by mouth every 6 (six) hours as needed for pain.   15 tablet   0     BP 153/88  Pulse 94  Temp(Src) 99.4 F (37.4 C) (Oral)  Resp 16  SpO2 96% Vitals reviewed Physical Exam Physical Examination: General appearance - alert, well appearing, and in no distress Eyes - pupils equal and reactive, extraocular eye movements intact Chest - clear to auscultation, no wheezes, rales or rhonchi, symmetric air entry Heart - normal rate, regular rhythm, normal S1, S2, no murmurs, rubs, clicks or gallops Abdomen - soft, nontender, nondistended, no masses or organomegaly Back exam - no midline tenderness to palpation, mild left lower paraspinal tenderness Extremities - peripheral pulses normal, no pedal edema, no clubbing or cyanosis Neurologic- strength 5/5 in extremities x 4, sensation intact Skin - normal coloration and turgor, no rashes  ED Course  Procedures (including critical care time)  Labs Reviewed - No data to display No results found.   1. Sciatica neuralgia, left       MDM  Pt presenting with chronic low back pain.  He has no signs or symptoms of cauda equina.  Pain is similar to his prior sciatica pain.  He was treated with pain meds and nausea meds, d/c with prescriptions for muscle relaxer as well.  Discharged with strict return  precautions.  Pt agreeable with plan.        Ethelda Chick, MD 03/08/13 [phone]

## 2013-03-08 ENCOUNTER — Ambulatory Visit (INDEPENDENT_AMBULATORY_CARE_PROVIDER_SITE_OTHER): Payer: Medicare Other | Admitting: Psychiatry

## 2013-03-08 ENCOUNTER — Encounter (HOSPITAL_COMMUNITY): Payer: Self-pay | Admitting: Psychiatry

## 2013-03-08 VITALS — Wt 229.0 lb

## 2013-03-08 DIAGNOSIS — F200 Paranoid schizophrenia: Secondary | ICD-10-CM

## 2013-03-08 MED ORDER — BENZTROPINE MESYLATE 0.5 MG PO TABS: 0.5000 mg | ORAL_TABLET | Freq: Every day | ORAL | Status: DC

## 2013-03-08 NOTE — Progress Notes (Signed)
Patient ID: Roger Lopez, male   DOB: 21-Nov-1956, 56 y.o.   MRN: [id number] Chief complaint Medication management and followup.  History presenting illness Patient is a 56 year old [place] male who came for his followup appointment.  Patient is complaining of leg and back pain.  He was receiving emergency department and given Percocet and muscle relaxant however he is not taking regularly do to constipation.  He scheduled to see Dr. Ethelene Hal for his pain management.  Overall his psychiatric condition is stable.  He is compliant with the psychotropic medication.  He denies any paranoia hallucination or any mood swing.  He sleep better but there are nights when he cannot sleep due to pain.  He started to leave this place and go outside for walking and enjoy his time.  He does not have any issues with her psychiatric medication.  He denies any shakes or tremors.  He's not drinking or using any illegal substance.  Current psychiatric medication Risperdal 2 mg at bedtime  Trazodone 150 mg at bedtime Cogentin 0.5 mg at bedtime   Past psychiatric history Patient has been seen in this office since 2005.  He is a long history of psychiatric illness.  He has at least 3 psychiatric admission.  His last admission was in 2011.  He has at least 1 suicidal attempt by taking overdose on his medication.  He endorse history of psychosis paranoia and hallucination.  Medical history Patient has history of back pain, hyperlipidemia, and sees Dr. Felipa Eth.  He has been getting injection for his back pain .  He scheduled to see his primary care physician in October for his blood work.    Psychosocial history Patient was born and raised in [place].  He has 2 living siblings.  He's been married twice.  He is currently living with his wife is been very supportive.  Patient has 6 children.  He is a good support from the family.  Alcohol and substance use history Patient endorse no recent alcohol or any illegal  substance use.  Review of Systems  Musculoskeletal: Positive for back pain and joint pain.  Psychiatric/Behavioral: Negative for depression, suicidal ideas, hallucinations, memory loss and substance abuse. The patient has insomnia.    Mental status examination Patient is casually dressed and fairly groomed.  He maintained fair eye contact.  His speech is slow but clear and coherent.  His thought process is also slowed but logical linear and goal-directed.  He is complaining of pain.  However there were no paranoia or delusions or any psychotic symptoms present at this time.  He has no tremors or shakes at this time.  His attention and concentration is fair.  He has no flight of idea or looseness of association.  He denies any active or passive suicidal thinking and homicidal thinking.  Denies any auditory or visual hallucination.  He's alert and oriented x3.  His insight judgment and impulse control is fair.  Assessment Axis I schizophrenia chronic paranoid type  Axis II deferred Axis III see medical history Axis IV mild Axis V 60-65   Plan I will continue his current psychiatric medication.  I recommend to call us if he has any question or concern about the medication or if he feel worsening of the symptoms.  I will see him again in 3 months.  I will give him Cogentin but patient has refill remaining on his trazodone and Risperdal.  Portion of this note is generated with voice recognition software and may  contain typographical error.

## 2013-06-08 ENCOUNTER — Encounter (HOSPITAL_COMMUNITY): Payer: Self-pay | Admitting: Psychiatry

## 2013-06-08 ENCOUNTER — Ambulatory Visit (INDEPENDENT_AMBULATORY_CARE_PROVIDER_SITE_OTHER): Payer: Medicare Other | Admitting: Psychiatry

## 2013-06-08 VITALS — BP 146/83 | HR 68 | Ht 70.0 in | Wt 226.0 lb

## 2013-06-08 DIAGNOSIS — F200 Paranoid schizophrenia: Secondary | ICD-10-CM

## 2013-06-08 MED ORDER — TRAZODONE HCL 150 MG PO TABS: ORAL_TABLET | ORAL | Status: DC

## 2013-06-08 MED ORDER — BENZTROPINE MESYLATE 0.5 MG PO TABS: 0.5000 mg | ORAL_TABLET | Freq: Every day | ORAL | Status: DC

## 2013-06-08 MED ORDER — RISPERIDONE 2 MG PO TABS: ORAL_TABLET | ORAL | Status: DC

## 2013-06-08 NOTE — Progress Notes (Signed)
Patient ID: Roger Lopez, male   DOB: 1956-10-08, 57 y.o.   MRN: [id number] Chief complaint Medication management and followup.  History presenting illness Patient is a 57 year old [place] male who came for his followup appointment.  He is complying with his psychiatric medication.  He denies any side effects.  His family is doing okay.  Recently he has no paranoia or any hallucination.  He complained of headaches and taking tramadol for his headaches.  His weight is unchanged from the past.  He does not leave his house unless he needed .  He does not feel comfortable around public places.  He likes his medication.  There has no tremors or shakes.  He is now drinking or using any illegal substance.  He denies any recent agitation anger or any mood swing.  Current psychiatric medication Risperdal 2 mg at bedtime  Trazodone 150 mg at bedtime Cogentin 0.5 mg at bedtime   Past psychiatric history Patient has been seen in this office since 2005.  He is a long history of psychiatric illness.  He has at least 3 psychiatric admission.  His last admission was in 2011.  He has at least 1 suicidal attempt by taking overdose on his medication.  He endorse history of psychosis paranoia and hallucination.  Medical history Patient has history of back pain, hyperlipidemia, and sees Dr. Felipa Eth.  He has been getting injection for his back pain .  He scheduled to see his primary care physician in October for his blood work.    Psychosocial history Patient was born and raised in [place].  He has 2 living siblings.  He's been married twice.  He is currently living with his wife is been very supportive.  Patient has 6 children.  He is a good support from the family.  Alcohol and substance use history Patient endorse no recent alcohol or any illegal substance use.  Review of Systems  Constitutional: Negative.   Cardiovascular: Negative.   Musculoskeletal: Positive for back pain and joint pain.   Skin: Negative.   Neurological: Positive for headaches. Negative for focal weakness, seizures and loss of consciousness.  Psychiatric/Behavioral: Negative for depression, suicidal ideas, hallucinations, memory loss and substance abuse. The patient has insomnia.    Mental status examination Patient is casually dressed and fairly groomed.  He maintained fair eye contact.  His speech is slow but clear and coherent.  His thought process is logical linear and goal-directed.  He is complaining of pain.  However there were no paranoia or delusions or any psychotic symptoms present at this time.  He has no tremors or shakes at this time.  His attention and concentration is fair.  He has no flight of idea or looseness of association.  He described his mood is neutral and his affect is mood appropriate.  He denies any active or passive suicidal thinking and homicidal thinking.  Denies any auditory or visual hallucination.  He's alert and oriented x3.  His insight judgment and impulse control is fair.  Assessment Axis I schizophrenia chronic paranoid type  Axis II deferred Axis III see medical history Axis IV mild Axis V 60-65   Plan I will continue his current psychiatric medication Risperdal 2 mg at bedtime, trazodone 150 mg at bedtime and Cogentin 0.5 mg at bedtime.  Recommend to call us if he is a question or concern or if he feel worsening of the symptom.  I will see him in 3 months.  Portion of this note  is generated with voice recognition software and may contain typographical error.

## 2013-09-01 ENCOUNTER — Encounter (HOSPITAL_COMMUNITY): Payer: Self-pay | Admitting: Emergency Medicine

## 2013-09-01 ENCOUNTER — Emergency Department (HOSPITAL_COMMUNITY)
Admission: EM | Admit: 2013-09-01 | Discharge: 2013-09-02 | Disposition: A | Discharge status: Home / Self Care | Payer: Medicare Other | Attending: Emergency Medicine | Admitting: Emergency Medicine

## 2013-09-01 DIAGNOSIS — Z79899 Other long term (current) drug therapy: Secondary | ICD-10-CM | POA: Insufficient documentation

## 2013-09-01 DIAGNOSIS — E7800 Pure hypercholesterolemia, unspecified: Secondary | ICD-10-CM | POA: Insufficient documentation

## 2013-09-01 DIAGNOSIS — Z8719 Personal history of other diseases of the digestive system: Secondary | ICD-10-CM | POA: Insufficient documentation

## 2013-09-01 DIAGNOSIS — G43909 Migraine, unspecified, not intractable, without status migrainosus: Secondary | ICD-10-CM

## 2013-09-01 MED ORDER — ONDANSETRON HCL 4 MG PO TABS: 4.0000 mg | ORAL_TABLET | Freq: Four times a day (QID) | ORAL | Status: DC

## 2013-09-01 MED ORDER — HYDROMORPHONE HCL PF 1 MG/ML IJ SOLN
1.0000 mg | Freq: Once | INTRAMUSCULAR | Status: AC
  Administered 2013-09-01: 1 mg via INTRAMUSCULAR
  Filled 2013-09-01: qty 1

## 2013-09-01 MED ORDER — ONDANSETRON 4 MG PO TBDP
4.0000 mg | ORAL_TABLET | Freq: Once | ORAL | Status: AC
  Administered 2013-09-01: 4 mg via ORAL
  Filled 2013-09-01: qty 1

## 2013-09-01 MED ORDER — TRAMADOL HCL 50 MG PO TABS: 50.0000 mg | ORAL_TABLET | Freq: Four times a day (QID) | ORAL | Status: DC | PRN

## 2013-09-01 NOTE — ED Provider Notes (Signed)
CSN: [id number]     Arrival date & time 09/01/13  2144 History   First MD Initiated Contact with Patient 09/01/13 2316     Chief Complaint  Patient presents with  . Migraine   HPI   History of migraines. States he frequently gets one or 2-3 per year. This lasted in May this year for headaches. Typical onset for him 2 days ago. His wife at length. There is a. Weak some nausea. He developed some photophobia retro-orbital headache. Not a sudden thunderclap-type. He has no atypical features. Hasn't been febrile. He was "managing" at home he had an episode of vomiting hours ago and another episode while en route here. No neck pain no additional symptoms Past Medical History  Diagnosis Date  . Hypercholesteremia   . Acid reflux   . Head ache   . Back pain    Past Surgical History  Procedure Laterality Date  . Tonsillectomy     Family History  Problem Relation Age of Onset  . Bipolar disorder Father   . Bipolar disorder Sister   . Bipolar disorder Sister    History  Substance Use Topics  . Smoking status: Never Smoker   . Smokeless tobacco: Not on file  . Alcohol Use: No    Review of Systems  Constitutional: Negative for fever, chills, diaphoresis, appetite change and fatigue.  HENT: Negative for sore throat, mouth sores and trouble swallowing.   Eyes: Negative for visual disturbance.  Respiratory: Negative for cough, chest tightness, shortness of breath and wheezing.   Cardiovascular: Negative for chest pain.  Gastrointestinal: Positive for nausea. Negative for vomiting, abdominal pain, diarrhea and abdominal distention.  Endocrine: Negative for polydipsia, polyphagia and polyuria.  Genitourinary: Negative for dysuria, frequency and hematuria.  Musculoskeletal: Negative for gait problem.  Skin: Negative for color change, pallor and rash.  Neurological: Positive for headaches. Negative for dizziness, syncope and light-headedness.  Hematological: Does not bruise/bleed easily.   Psychiatric/Behavioral: Negative for behavioral problems and confusion.    Allergies  Review of patient's allergies indicates no known allergies.  Home Medications   Current Outpatient Rx  Name  Route  Sig  Dispense  Refill  . atorvastatin (LIPITOR) 40 MG tablet   Oral   Take 40 mg by mouth daily.          . benztropine (COGENTIN) 0.5 MG tablet   Oral   Take 1 tablet (0.5 mg total) by mouth at bedtime.   30 tablet   2   . fluticasone (FLONASE) 50 MCG/ACT nasal spray   Nasal   Place 2 sprays into the nose daily.          Marland Kitchen NEXIUM 40 MG capsule   Oral   Take 40 mg by mouth daily.          Marland Kitchen oxyCODONE-acetaminophen (PERCOCET/ROXICET) 5-325 MG per tablet   Oral   Take 1-2 tablets by mouth every 6 (six) hours as needed for pain.   15 tablet   0   . risperiDONE (RISPERDAL) 2 MG tablet      take 1 tablet by mouth at bedtime   30 tablet   2   . traMADol (ULTRAM) 50 MG tablet   Oral   Take 1 tablet (50 mg total) by mouth every 6 (six) hours as needed. PAIN   20 tablet   0   . traZODone (DESYREL) 150 MG tablet   Oral   Take 150 mg by mouth 3 times/day as needed-between meals &  bedtime for sleep.         . cyclobenzaprine (FLEXERIL) 5 MG tablet   Oral   Take 1 tablet (5 mg total) by mouth 3 (three) times daily as needed for muscle spasms.   20 tablet   0   . ondansetron (ZOFRAN) 4 MG tablet   Oral   Take 1 tablet (4 mg total) by mouth every 6 (six) hours.   12 tablet   0   . traMADol (ULTRAM) 50 MG tablet   Oral   Take 1 tablet (50 mg total) by mouth every 6 (six) hours as needed for pain.   15 tablet   0    BP 150/86  Pulse 88  Temp(Src) 98.3 F (36.8 C) (Oral)  Resp 20  SpO2 100% Physical Exam  Constitutional: He is oriented to person, place, and time. He appears well-developed and well-nourished. No distress.  HENT:  Head: Normocephalic.  Eyes: Conjunctivae are normal. Pupils are equal, round, and reactive to light. No scleral icterus.   And reactive. His extra ocular muscles are normal. Normal exam to the skin of the head neck and face. Vesicles. Nares clear. TMs appear normal.  Neck: Normal range of motion. Neck supple. No thyromegaly present.  Has a supple neck. No meningismus.  Cardiovascular: Normal rate and regular rhythm.  Exam reveals no gallop and no friction rub.   No murmur heard. Pulmonary/Chest: Effort normal and breath sounds normal. No respiratory distress. He has no wheezes. He has no rales.  Abdominal: Soft. Bowel sounds are normal. He exhibits no distension. There is no tenderness. There is no rebound.  Musculoskeletal: Normal range of motion.  Neurological: He is alert and oriented to person, place, and time.  Normal cranial nerve exam. He is awake alert and mentating well. He relates to the room.  Skin: Skin is warm and dry. No rash noted.  Psychiatric: He has a normal mood and affect. His behavior is normal.    ED Course  Procedures (including critical care time) Labs Review Labs Reviewed - No data to display Imaging Review No results found.  MDM   1. Migraine    Critical features. He reports migraines. Per review of the chart these are infrequent. Is given doses.1 mg IM. Zofran by mouth. Discharged home with a limited number of tramadol and Zofran.     Claudean Kinds, MD 09/01/13 [phone]

## 2013-09-01 NOTE — ED Notes (Signed)
Pt stated he has a migraine behind both eyes, sensitivity to lights and sounds, nausea and vomiting and diarrhea since Tuesday.  Pt reports blurred vision and dizziness. Pt stated he has taken no OTC meds for pain because "nothing works for my migraines."

## 2013-09-02 MED ORDER — HYDROMORPHONE HCL PF 1 MG/ML IJ SOLN
1.0000 mg | INTRAMUSCULAR | Status: DC | PRN
  Administered 2013-09-02: 1 mg via INTRAMUSCULAR
  Filled 2013-09-02: qty 1

## 2013-09-08 ENCOUNTER — Ambulatory Visit (INDEPENDENT_AMBULATORY_CARE_PROVIDER_SITE_OTHER): Payer: Medicare Other | Admitting: Psychiatry

## 2013-09-08 ENCOUNTER — Encounter (HOSPITAL_COMMUNITY): Payer: Self-pay | Admitting: Psychiatry

## 2013-09-08 VITALS — BP 144/85 | HR 89 | Ht 70.0 in | Wt 227.0 lb

## 2013-09-08 DIAGNOSIS — F200 Paranoid schizophrenia: Secondary | ICD-10-CM

## 2013-09-08 MED ORDER — BENZTROPINE MESYLATE 0.5 MG PO TABS: 0.5000 mg | ORAL_TABLET | Freq: Every day | ORAL | Status: DC

## 2013-09-08 MED ORDER — RISPERIDONE 2 MG PO TABS: ORAL_TABLET | ORAL | Status: DC

## 2013-09-08 MED ORDER — TRAZODONE HCL 150 MG PO TABS: 150.0000 mg | ORAL_TABLET | Freq: Every day | ORAL | Status: DC

## 2013-09-08 NOTE — Progress Notes (Signed)
[facility] [address] Progress Note  Roger Lopez [id number] 57 y.o.  09/08/2013 2:56 PM  Chief Complaint:  Medication management and followup.  History of Present Illness:  Patient is a 57 year old man African American man looking for his followup appointment.  He is taking his medication and denies any side effects.  Recently he visited emergency room for migraine headache.  He was given Zofran and tramadol.  He denies any recent attack of migraine.  He is sleeping better.  He is taking his medication trazodone Cogentin and Risperdal.  He denies any anger irritability mood swings or any paranoia.  Denies any tremors or shakes.  He continues to have chronic anxiety and nervousness and does not leave his house unless needed.  Patient is not drinking or using any illegal substances.  Suicidal Ideation: No Plan Formed: No Patient has means to carry out plan: No  Homicidal Ideation: No Plan Formed: No Patient has means to carry out plan: No  Medical History; Patient has history of back pain, hyperlipidemia and migraine headaches.  He sees Dr. Virgel Manifold.  Psycho sial History; Patient was born and raised in [place]. He has 2 living siblings. He's been married twice. He is currently living with his wife is been very supportive. Patient has 6 children. He is a good support from the family.  Review of Systems: Psychiatric: Agitation: No Hallucination: No Depressed Mood: No Insomnia: Yes Hypersomnia: No Altered Concentration: No Feels Worthless: No Grandiose Ideas: No Belief In Special Powers: No New/Increased Substance Abuse: No Compulsions: No  Neurologic: Headache: No Seizure: No Paresthesias: No    Outpatient Encounter Prescriptions as of 09/08/2013  Medication Sig Dispense Refill  . atorvastatin (LIPITOR) 40 MG tablet Take 40 mg by mouth daily.       . benztropine (COGENTIN) 0.5 MG tablet Take 1 tablet (0.5 mg total) by mouth at bedtime.  30 tablet  2  .  fluticasone (FLONASE) 50 MCG/ACT nasal spray Place 2 sprays into the nose daily.       Marland Kitchen NEXIUM 40 MG capsule Take 40 mg by mouth daily.       Marland Kitchen oxyCODONE-acetaminophen (PERCOCET/ROXICET) 5-325 MG per tablet Take 1-2 tablets by mouth every 6 (six) hours as needed for pain.  15 tablet  0  . risperiDONE (RISPERDAL) 2 MG tablet take 1 tablet by mouth at bedtime  30 tablet  2  . traMADol (ULTRAM) 50 MG tablet Take 1 tablet (50 mg total) by mouth every 6 (six) hours as needed. PAIN  20 tablet  0  . traZODone (DESYREL) 150 MG tablet Take 1 tablet (150 mg total) by mouth at bedtime.  30 tablet  2  . [DISCONTINUED] benztropine (COGENTIN) 0.5 MG tablet Take 1 tablet (0.5 mg total) by mouth at bedtime.  30 tablet  2  . [DISCONTINUED] risperiDONE (RISPERDAL) 2 MG tablet take 1 tablet by mouth at bedtime  30 tablet  2  . [DISCONTINUED] traMADol (ULTRAM) 50 MG tablet Take 1 tablet (50 mg total) by mouth every 6 (six) hours as needed for pain.  15 tablet  0  . [DISCONTINUED] traZODone (DESYREL) 150 MG tablet Take 150 mg by mouth 3 times/day as needed-between meals & bedtime for sleep.      . [DISCONTINUED] cyclobenzaprine (FLEXERIL) 5 MG tablet Take 1 tablet (5 mg total) by mouth 3 (three) times daily as needed for muscle spasms.  20 tablet  0  . [DISCONTINUED] ondansetron (ZOFRAN) 4 MG tablet Take 1 tablet (4  mg total) by mouth every 6 (six) hours.  12 tablet  0   No facility-administered encounter medications on file as of 09/08/2013.    No results found for this or any previous visit (from the past 2160 hour(s)).  Past Psychiatric History/Hospitalization(s) Patient is seen in this office since 2005.  He has at least 3 psychiatric hospitalizations.  His last psychiatric admission was in 2011.  Patient has one suicidal attempt by taking overdose on his medication.  He has a history of psychosis paranoia and hallucinations Anxiety: No Bipolar Disorder: No Depression: Yes Mania: No Psychosis:  Yes Schizophrenia: Yes Personality Disorder: No Hospitalization for psychiatric illness: Yes History of Electroconvulsive Shock Therapy: No Prior Suicide Attempts: Yes  Physical Exam: Constitutional:  BP 144/85  Pulse 89  Ht 5\' 10"  (1.778 m)  Wt 227 lb (102.967 kg)  BMI 32.57 kg/m2  Musculoskeletal: Strength & Muscle Tone: within normal limits Gait & Station: normal Patient leans: N/A  Mental Status Examination;  Patient is casually dressed and fairly groomed.  He maintains fair eye contact.  He is pleasant and cooperative.  He denies any active or passive suicidal thoughts or homicidal thoughts.  He denied any auditory or visual hallucination.  His attention and concentration is fair.  There were no delusions present at this time.  His fund of knowledge is adequate.  He is alert and oriented x3.  His insight judgment and impulse control is okay.   Medical Decision Making (Choose Three): Established Problem, Stable/Improving (1), Review of Last Therapy Session (1) and Review of New Medication or Change in Dosage (2)  Assessment: Axis I: Schizophrenia chronic paranoid type  Axis II: Deferred  Axis III:  Past Medical History  Diagnosis Date  . Hypercholesteremia   . Acid reflux   . Head ache   . Back pain     Axis IV: Mild   Plan:  I will continue his current psychiatric medications which are Risperdal 2 mg at bedtime, trazodone 150 mg at bedtime and Cogentin 0.5 mg at bedtime.  Patient has blood work done at his primary care physician.  We will get his record from his primary care physician.  Followup in 3 months.  Recommend to call us back if he has any question or any concern.  Anora Schwenke T., MD 09/08/2013

## 2013-12-08 ENCOUNTER — Encounter (HOSPITAL_COMMUNITY): Payer: Self-pay | Admitting: Psychiatry

## 2013-12-08 ENCOUNTER — Ambulatory Visit (INDEPENDENT_AMBULATORY_CARE_PROVIDER_SITE_OTHER): Payer: Medicare Other | Admitting: Psychiatry

## 2013-12-08 VITALS — BP 160/88 | HR 70 | Ht 69.72 in | Wt 238.6 lb

## 2013-12-08 DIAGNOSIS — F200 Paranoid schizophrenia: Secondary | ICD-10-CM

## 2013-12-08 MED ORDER — BENZTROPINE MESYLATE 0.5 MG PO TABS: 0.5000 mg | ORAL_TABLET | Freq: Every day | ORAL | Status: DC

## 2013-12-08 MED ORDER — RISPERIDONE 2 MG PO TABS: ORAL_TABLET | ORAL | Status: DC

## 2013-12-08 MED ORDER — TRAZODONE HCL 150 MG PO TABS: 150.0000 mg | ORAL_TABLET | Freq: Every day | ORAL | Status: DC

## 2013-12-08 NOTE — Progress Notes (Signed)
[facility] [id number] Progress Note  Roger Lopez [id number] 57 y.o.  12/08/2013 2:44 PM  Chief Complaint:  Medication management and followup.  History of Present Illness:  Roger Lopez came for his followup appointment.  He is taking his medication denies any side effects.  He was upset because his first cousin who is looking for [employer] was killed last week .  The patient has no details but he was very upset and shock.  Patient admitted that he not sleeping very well but denies any paranoia, hallucination or any suicidal thoughts.  He likes his current psychotropic medication.  He is a good Thanksgiving.  He has a blood work in November at [facility] [place].  .  There is no changes in his medication.  He was to continue his current psychotropic medication.  He denies any anger, irritability, paranoia or any hallucination.  He is not drinking or using any illegal substances.  He denies any tremors or any shakes.  Suicidal Ideation: No Plan Formed: No Patient has means to carry out plan: No  Homicidal Ideation: No Plan Formed: No Patient has means to carry out plan: No  Medical History; Patient has history of back pain, hyperlipidemia and migraine headaches.  He sees Dr. Virgel Manifold.  He has a blood work in November 2014 .  His basic chemistry is within normal limits.  His liver function tests were normal.  His creatinine was 1.0 and BUN 12 .  He has normal WBC count.  His cholesterol was 210 .  His TSH was normal  Psychosial History; Patient was born and raised in [place]. He has 2 living siblings. He's been married twice. He is currently living with his wife is been very supportive. Patient has 6 children. He is a good support from the family.  Review of Systems: Psychiatric: Agitation: No Hallucination: No Depressed Mood: No Insomnia: Yes Hypersomnia: No Altered Concentration: No Feels Worthless: No Grandiose Ideas: No Belief In Special Powers: No New/Increased  Substance Abuse: No Compulsions: No  Neurologic: Headache: No Seizure: No Paresthesias: No    Outpatient Encounter Prescriptions as of 12/08/2013  Medication Sig  . benztropine (COGENTIN) 0.5 MG tablet Take 1 tablet (0.5 mg total) by mouth at bedtime.  . risperiDONE (RISPERDAL) 2 MG tablet take 1 tablet by mouth at bedtime  . traZODone (DESYREL) 150 MG tablet Take 1 tablet (150 mg total) by mouth at bedtime.  . [DISCONTINUED] benztropine (COGENTIN) 0.5 MG tablet Take 1 tablet (0.5 mg total) by mouth at bedtime.  . [DISCONTINUED] risperiDONE (RISPERDAL) 2 MG tablet take 1 tablet by mouth at bedtime  . [DISCONTINUED] traZODone (DESYREL) 150 MG tablet Take 1 tablet (150 mg total) by mouth at bedtime.  Marland Kitchen atorvastatin (LIPITOR) 40 MG tablet Take 40 mg by mouth daily.   . fluticasone (FLONASE) 50 MCG/ACT nasal spray Place 2 sprays into the nose daily.   Marland Kitchen NEXIUM 40 MG capsule Take 40 mg by mouth daily.   Marland Kitchen oxyCODONE-acetaminophen (PERCOCET/ROXICET) 5-325 MG per tablet Take 1-2 tablets by mouth every 6 (six) hours as needed for pain.  . traMADol (ULTRAM) 50 MG tablet Take 1 tablet (50 mg total) by mouth every 6 (six) hours as needed. PAIN    No results found for this or any previous visit (from the past 2160 hour(s)).  Past Psychiatric History/Hospitalization(s) Patient is seen in this office since 2005.  He has at least 3 psychiatric hospitalizations.  His last psychiatric admission was in 2011.  Patient has  one suicidal attempt by taking overdose on his medication.  He has a history of psychosis paranoia and hallucinations Anxiety: No Bipolar Disorder: No Depression: Yes Mania: No Psychosis: Yes Schizophrenia: Yes Personality Disorder: No Hospitalization for psychiatric illness: Yes History of Electroconvulsive Shock Therapy: No Prior Suicide Attempts: Yes  Physical Exam: Constitutional:  BP 160/88  Pulse 70  Ht 5' 9.72" (1.771 m)  Wt 238 lb 9.6 oz (108.228 kg)  BMI 34.51  kg/m2  Musculoskeletal: Strength & Muscle Tone: within normal limits Gait & Station: normal Patient leans: N/A  Mental Status Examination;  Patient is casually dressed and fairly groomed.  He maintains fair eye contact.  He is pleasant and cooperative.  He denies any active or passive suicidal thoughts or homicidal thoughts.  He denied any auditory or visual hallucination.  His attention and concentration is fair.  There were no delusions present at this time.  His fund of knowledge is adequate.  He is alert and oriented x3.  His insight judgment and impulse control is okay.   Medical Decision Making (Choose Three): Established Problem, Stable/Improving (1), Review of Last Therapy Session (1) and Review of New Medication or Change in Dosage (2)  Assessment: Axis I: Schizophrenia chronic paranoid type  Axis II: Deferred  Axis III:  Past Medical History  Diagnosis Date  . Hypercholesteremia   . Acid reflux   . Head ache   . Back pain     Axis IV: Mild   Plan:  I will continue his current psychiatric medications which are Risperdal 2 mg at bedtime, trazodone 150 mg at bedtime and Cogentin 0.5 mg at bedtime.   Followup in 3 months.  Recommend to call us back if he has any question or any concern.  [name] T., MD 12/08/2013

## 2014-03-06 ENCOUNTER — Ambulatory Visit (INDEPENDENT_AMBULATORY_CARE_PROVIDER_SITE_OTHER): Payer: Medicare Other | Admitting: Psychiatry

## 2014-03-06 ENCOUNTER — Encounter (HOSPITAL_COMMUNITY): Payer: Self-pay | Admitting: Psychiatry

## 2014-03-06 VITALS — BP 151/74 | HR 81 | Ht 69.72 in | Wt 229.2 lb

## 2014-03-06 DIAGNOSIS — F200 Paranoid schizophrenia: Secondary | ICD-10-CM

## 2014-03-06 MED ORDER — TRAZODONE HCL 150 MG PO TABS: 150.0000 mg | ORAL_TABLET | Freq: Every day | ORAL | Status: DC

## 2014-03-06 MED ORDER — BENZTROPINE MESYLATE 0.5 MG PO TABS: 0.5000 mg | ORAL_TABLET | Freq: Every day | ORAL | Status: DC

## 2014-03-06 MED ORDER — RISPERIDONE 2 MG PO TABS: ORAL_TABLET | ORAL | Status: DC

## 2014-03-06 NOTE — Progress Notes (Signed)
[facility] [address] Progress Note  Roger Lopez [id number] 58 y.o.  03/06/2014 2:59 PM  Chief Complaint:  Medication management and followup.  History of Present Illness:  Roger Lopez came for his followup appointment.  He is compliant with his psychotropic medication.  He had a good Christmas.  He denies any side effects of medication.  He is sleeping better with trazodone.  He has migraine headaches and he is recently given tramadol which is helping his migraine.  Patient is still waiting for the investigation from [facility] .  His son was killed 3 months ago who was working for [employer].  The patient told it was a car accident however the details are unknown.  The patient told them they still upset but they are getting along better.  She denies any agitation, anger, mood swing.  He denies any hallucination or any paranoia.  His appetite is unchanged from the past.  He was to continue his current psychotropic medication. He is not drinking or using any illegal substances.  He denies any tremors or any shakes.  Suicidal Ideation: No Plan Formed: No Patient has means to carry out plan: No  Homicidal Ideation: No Plan Formed: No Patient has means to carry out plan: No  Medical History; Patient has history of back pain, hyperlipidemia and migraine headaches.  He sees Dr. Virgel Lopez.  He has a blood work in November 2014 .  His basic chemistry is within normal limits.  His liver function tests were normal.  His creatinine was 1.0 and BUN 12 .  He has normal WBC count.  His cholesterol was 210 .  His TSH was normal  Psychosial History; Patient was born and raised in [place]. He has 2 living siblings. He's been married twice. He is currently living with his wife is been very supportive. Patient has 6 children. He is a good support from the family.  Review of Systems: Psychiatric: Agitation: No Hallucination: No Depressed Mood: No Insomnia: Yes Hypersomnia: No Altered Concentration: No Feels  Worthless: No Grandiose Ideas: No Belief In Special Powers: No New/Increased Substance Abuse: No Compulsions: No  Neurologic: Headache: No Seizure: No Paresthesias: No    Outpatient Encounter Prescriptions as of 03/06/2014  Medication Sig  . benztropine (COGENTIN) 0.5 MG tablet Take 1 tablet (0.5 mg total) by mouth at bedtime.  . risperiDONE (RISPERDAL) 2 MG tablet take 1 tablet by mouth at bedtime  . traZODone (DESYREL) 150 MG tablet Take 1 tablet (150 mg total) by mouth at bedtime.  . [DISCONTINUED] benztropine (COGENTIN) 0.5 MG tablet Take 1 tablet (0.5 mg total) by mouth at bedtime.  . [DISCONTINUED] risperiDONE (RISPERDAL) 2 MG tablet take 1 tablet by mouth at bedtime  . [DISCONTINUED] traZODone (DESYREL) 150 MG tablet Take 1 tablet (150 mg total) by mouth at bedtime.  Marland Kitchen atorvastatin (LIPITOR) 40 MG tablet Take 40 mg by mouth daily.   . fluticasone (FLONASE) 50 MCG/ACT nasal spray Place 2 sprays into the nose daily.   Marland Kitchen NEXIUM 40 MG capsule Take 40 mg by mouth daily.   Marland Kitchen oxyCODONE-acetaminophen (PERCOCET/ROXICET) 5-325 MG per tablet Take 1-2 tablets by mouth every 6 (six) hours as needed for pain.  . traMADol (ULTRAM) 50 MG tablet Take 1 tablet (50 mg total) by mouth every 6 (six) hours as needed. PAIN    No results found for this or any previous visit (from the past 2160 hour(s)).  Past Psychiatric History/Hospitalization(s) Patient is seen in this office since 2005.  He has  at least 3 psychiatric hospitalizations.  His last psychiatric admission was in 2011.  Patient has one suicidal attempt by taking overdose on his medication.  He has a history of psychosis paranoia and hallucinations Anxiety: No Bipolar Disorder: No Depression: Yes Mania: No Psychosis: Yes Schizophrenia: Yes Personality Disorder: No Hospitalization for psychiatric illness: Yes History of Electroconvulsive Shock Therapy: No Prior Suicide Attempts: Yes  Physical Exam: Constitutional:  BP 151/74   Pulse 81  Ht 5' 9.72" (1.771 m)  Wt 229 lb 3.2 oz (103.964 kg)  BMI 33.15 kg/m2  Musculoskeletal: Strength & Muscle Tone: within normal limits Gait & Station: normal Patient leans: N/A  Mental Status Examination;  Patient is casually dressed and fairly groomed.  He maintains fair eye contact.  He is pleasant and cooperative.  He denies any active or passive suicidal thoughts or homicidal thoughts.  He denied any auditory or visual hallucination.  His attention and concentration is fair.  There were no delusions present at this time.  His fund of knowledge is adequate.  He is alert and oriented x3.  His insight judgment and impulse control is okay.  Established Problem, Stable/Improving (1), Review of Last Therapy Session (1) and Review of New Medication or Change in Dosage (2)  Assessment: Axis I: Schizophrenia chronic paranoid type  Axis II: Deferred  Axis III:  Past Medical History  Diagnosis Date  . Hypercholesteremia   . Acid reflux   . Head ache   . Back pain     Axis IV: Mild   Plan:  I will continue his current psychiatric medications which are Risperdal 2 mg at bedtime, trazodone 150 mg at bedtime and Cogentin 0.5 mg at bedtime.   Followup in 3 months.  Recommend to call us back if he has any question or any concern.  Roger Lopez T., MD 03/06/2014

## 2014-03-08 ENCOUNTER — Ambulatory Visit (HOSPITAL_COMMUNITY): Payer: Self-pay | Admitting: Psychiatry

## 2014-03-10 ENCOUNTER — Emergency Department (HOSPITAL_COMMUNITY)
Admission: EM | Admit: 2014-03-10 | Discharge: 2014-03-11 | Disposition: A | Discharge status: Home / Self Care | Payer: Medicare Other | Attending: Emergency Medicine | Admitting: Emergency Medicine

## 2014-03-10 DIAGNOSIS — R519 Headache, unspecified: Secondary | ICD-10-CM

## 2014-03-10 DIAGNOSIS — H53149 Visual discomfort, unspecified: Secondary | ICD-10-CM | POA: Insufficient documentation

## 2014-03-10 DIAGNOSIS — Z79899 Other long term (current) drug therapy: Secondary | ICD-10-CM | POA: Insufficient documentation

## 2014-03-10 DIAGNOSIS — R51 Headache: Secondary | ICD-10-CM | POA: Insufficient documentation

## 2014-03-10 DIAGNOSIS — K219 Gastro-esophageal reflux disease without esophagitis: Secondary | ICD-10-CM | POA: Insufficient documentation

## 2014-03-10 DIAGNOSIS — E7800 Pure hypercholesterolemia, unspecified: Secondary | ICD-10-CM | POA: Insufficient documentation

## 2014-03-11 ENCOUNTER — Encounter (HOSPITAL_COMMUNITY): Payer: Self-pay | Admitting: Emergency Medicine

## 2014-03-11 MED ORDER — HYDROMORPHONE HCL PF 1 MG/ML IJ SOLN
1.0000 mg | Freq: Once | INTRAMUSCULAR | Status: AC
  Administered 2014-03-11: 1 mg via INTRAMUSCULAR
  Filled 2014-03-11: qty 1

## 2014-03-11 MED ORDER — METOCLOPRAMIDE HCL 5 MG/ML IJ SOLN
10.0000 mg | Freq: Once | INTRAMUSCULAR | Status: AC
  Administered 2014-03-11: 10 mg via INTRAMUSCULAR
  Filled 2014-03-11: qty 2

## 2014-03-11 MED ORDER — TRAMADOL HCL 50 MG PO TABS: 50.0000 mg | ORAL_TABLET | Freq: Four times a day (QID) | ORAL | Status: DC | PRN

## 2014-03-11 NOTE — Discharge Instructions (Signed)

## 2014-03-11 NOTE — ED Provider Notes (Signed)
CSN: [id number]     Arrival date & time 03/10/14  2334 History   First MD Initiated Contact with Patient 03/11/14 0014     Chief Complaint  Patient presents with  . Migraine  . Nausea     (Consider location/radiation/quality/duration/timing/severity/associated sxs/prior Treatment) HPI Comments: Patient is a 58 year old male with a history of migraine headaches who presents to the emergency department for a headache x3 days. Patient states that pain is present behind his eyes and is throbbing in nature. Patient has taken tramadol for her symptoms without relief. He states that headache was without thunderclap onset and feels the same as headaches he has had in the past. Symptoms associated with nausea, emesis x2 prior to arrival, and photophobia. Patient states he has spoken with his primary care doctor about following up with a neurologist. Patient denies associated fever, neck pain or stiffness, numbness/tingling, extremity weakness, difficulty speaking or swallowing, head injury or trauma, hematemesis, and vision changes or vision loss. Patient denies the use of blood thinners.  Patient is a 58 y.o. male presenting with migraines. The history is provided by the patient. No language interpreter was used.  Migraine Associated symptoms include headaches, nausea and vomiting.    Past Medical History  Diagnosis Date  . Hypercholesteremia   . Acid reflux   . Head ache   . Back pain    Past Surgical History  Procedure Laterality Date  . Tonsillectomy     Family History  Problem Relation Age of Onset  . Bipolar disorder Father   . Bipolar disorder Sister   . Bipolar disorder Sister    History  Substance Use Topics  . Smoking status: Never Smoker   . Smokeless tobacco: Not on file  . Alcohol Use: No    Review of Systems  Eyes: Positive for photophobia.  Gastrointestinal: Positive for nausea and vomiting.  Neurological: Positive for headaches.  All other systems reviewed and are  negative.      Allergies  Review of patient's allergies indicates no known allergies.  Home Medications   Current Outpatient Rx  Name  Route  Sig  Dispense  Refill  . atorvastatin (LIPITOR) 40 MG tablet   Oral   Take 40 mg by mouth daily.          Marland Kitchen NEXIUM 40 MG capsule   Oral   Take 40 mg by mouth daily.           BP 135/82  Pulse 84  Temp(Src) 98.4 F (36.9 C) (Oral)  Resp 20  Ht 5\' 11"  (1.803 m)  Wt 230 lb (104.327 kg)  BMI 32.09 kg/m2  SpO2 98%  Physical Exam  Nursing note and vitals reviewed. Constitutional: He is oriented to person, place, and time. He appears well-developed and well-nourished. No distress.  HENT:  Head: Normocephalic and atraumatic.  Mouth/Throat: Oropharynx is clear and moist. No oropharyngeal exudate.  Eyes: Conjunctivae and EOM are normal. Pupils are equal, round, and reactive to light. No scleral icterus.  Neck: Normal range of motion. Neck supple.  Cardiovascular: Normal rate, regular rhythm and normal heart sounds.   Pulmonary/Chest: Effort normal and breath sounds normal. No respiratory distress. He has no wheezes. He has no rales.  Musculoskeletal: Normal range of motion.  Neurological: He is alert and oriented to person, place, and time. He has normal reflexes. He displays normal reflexes. No cranial nerve deficit. He exhibits normal muscle tone.  GCS 15. Speech is goal oriented. No cranial nerve deficits appreciated; symmetric  eyebrow raise, no facial drooping, and equal tongue protrusion. Patient has normal and equal grip strength bilaterally with 5/5 strength against resistance in all extremities. No gross sensory deficits appreciated. Patellar and Achilles reflexes 2+ bilaterally. He ambulates with normal gait.  Skin: Skin is warm and dry. No rash noted. He is not diaphoretic. No erythema. No pallor.  Psychiatric: He has a normal mood and affect. His behavior is normal.    ED Course  Procedures (including critical care  time) Labs Review Labs Reviewed - No data to display  Imaging Review No results found.   EKG Interpretation None      MDM   Final diagnoses:  Headache    58 year old male presents for headache consistent with prior migraines. He denies thunderclap onset of symptoms as well as the use of blood thinners. Symptoms have been ongoing for 3 days. Patient has a normal neurologic exam today; no focal neurologic deficits appreciated. Doubt acute emergent intracranial process in this patient given reassuring exam today as well as the fact that symptoms have been consistent with prior headaches.  Upon review of patient's chart, patient has been given Dilaudid and antiemetic for symptoms in the past. Have treated with same today. Patient states that pain has improved from 10/10 to 3/10. He states that he now thinks he can manage his symptoms further at home. Have advised primary care followup on Monday. Return precautions provided and patient agreeable to plan with no unaddressed concerns.    Antony MaduraKelly Serenity Fortner, PA-C 03/11/14 [phone]

## 2014-03-11 NOTE — ED Provider Notes (Signed)
Medical screening examination/treatment/procedure(s) were performed by non-physician practitioner and as supervising physician I was immediately available for consultation/collaboration.   EKG Interpretation None       Olivia Mackielga M Jeanna Giuffre, MD 03/11/14 [phone]

## 2014-03-11 NOTE — ED Notes (Signed)
Pt states he has had this headache for three days and today he just can't bear it,  He said last time he was here the medication worked great,  Pt has light sensitivity also.  10/10

## 2014-06-06 ENCOUNTER — Ambulatory Visit (INDEPENDENT_AMBULATORY_CARE_PROVIDER_SITE_OTHER): Payer: Medicare Other | Admitting: Psychiatry

## 2014-06-06 ENCOUNTER — Encounter (HOSPITAL_COMMUNITY): Payer: Self-pay | Admitting: Psychiatry

## 2014-06-06 VITALS — BP 156/82 | HR 78 | Ht 71.0 in | Wt 230.2 lb

## 2014-06-06 DIAGNOSIS — F200 Paranoid schizophrenia: Secondary | ICD-10-CM

## 2014-06-06 MED ORDER — RISPERIDONE 2 MG PO TABS: 2.0000 mg | ORAL_TABLET | Freq: Every day | ORAL | Status: DC

## 2014-06-06 MED ORDER — BENZTROPINE MESYLATE 0.5 MG PO TABS: 0.5000 mg | ORAL_TABLET | Freq: Every day | ORAL | Status: DC

## 2014-06-06 MED ORDER — TRAZODONE HCL 150 MG PO TABS: 150.0000 mg | ORAL_TABLET | Freq: Every day | ORAL | Status: DC

## 2014-06-06 NOTE — Progress Notes (Signed)
[facility] [id number] Progress Note  Roger Lopez [id number] 58 y.o.  06/06/2014 3:51 PM  Chief Complaint:  Medication management and followup.  History of Present Illness:  Roger PuttGrady came for his followup appointment.  He is compliant with his psychotropic medication.  He denies any side effects of medication.  He is sleeping better with trazodone.  He visited emergency room 2 months ago because of  migraine headaches and he was given tramadol which he is taking as needed.  Patient denies any agitation, anger, mood swing.  He is sleeping good.  He denies any paranoia or any worsening of the symptoms.  He wants to continue his current psychotropic medication.  He is not drinking or using any illegal substances.  His vitals are stable.  His weight is unchanged from the past.  He lives with his wife who is very supportive.  He has 6 children and he has a very support from his family.  Suicidal Ideation: No Plan Formed: No Patient has means to carry out plan: No  Homicidal Ideation: No Plan Formed: No Patient has means to carry out plan: No  Medical History; Patient has history of back pain, hyperlipidemia and migraine headaches.  He sees Dr. Virgel Lopez.    Review of Systems: Psychiatric: Agitation: No Hallucination: No Depressed Mood: No Insomnia: No Hypersomnia: No Altered Concentration: No Feels Worthless: No Grandiose Ideas: No Belief In Special Powers: No New/Increased Substance Abuse: No Compulsions: No  Neurologic: Headache: No Seizure: No Paresthesias: No    Outpatient Encounter Prescriptions as of 06/06/2014  Medication Sig  . atorvastatin (LIPITOR) 40 MG tablet Take 40 mg by mouth daily.   Marland Kitchen. NEXIUM 40 MG capsule Take 40 mg by mouth daily.   . benztropine (COGENTIN) 0.5 MG tablet Take 1 tablet (0.5 mg total) by mouth at bedtime.  . risperiDONE (RISPERDAL) 2 MG tablet Take 1 tablet (2 mg total) by mouth at bedtime.  . traZODone (DESYREL) 150 MG tablet Take 1 tablet  (150 mg total) by mouth at bedtime.    No results found for this or any previous visit (from the past 2160 hour(s)).  Past Psychiatric History/Hospitalization(s) Patient is seen in this office since 2005.  He has at least 3 psychiatric hospitalizations.  His last psychiatric admission was in 2011.  Patient has one suicidal attempt by taking overdose on his medication.  He has a history of psychosis paranoia and hallucinations Anxiety: No Bipolar Disorder: No Depression: Yes Mania: No Psychosis: Yes Schizophrenia: Yes Personality Disorder: No Hospitalization for psychiatric illness: Yes History of Electroconvulsive Shock Therapy: No Prior Suicide Attempts: Yes  Physical Exam: Constitutional:  BP 156/82  Pulse 78  Ht 5\' 11"  (1.803 m)  Wt 230 lb 3.2 oz (104.418 kg)  BMI 32.12 kg/m2  Musculoskeletal: Strength & Muscle Tone: within normal limits Gait & Station: normal Patient leans: N/A  Mental Status Examination;  Patient is casually dressed and fairly groomed.  He maintains fair eye contact.  He is pleasant and cooperative.  He denies any active or passive suicidal thoughts or homicidal thoughts.  He denied any auditory or visual hallucination.  His attention and concentration is fair.  There were no delusions present at this time.  His fund of knowledge is adequate.  He is alert and oriented x3.  His insight judgment and impulse control is okay.  Established Problem, Stable/Improving (1), Review of Last Therapy Session (1) and Review of New Medication or Change in Dosage (2)  Assessment: Axis I: Schizophrenia  chronic paranoid type  Axis II: Deferred  Axis III:  Past Medical History  Diagnosis Date  . Hypercholesteremia   . Acid reflux   . Head ache   . Back pain     Axis IV: Mild   Plan:  I will continue his current psychiatric medications which are Risperdal 2 mg at bedtime, trazodone 150 mg at bedtime and Cogentin 0.5 mg at bedtime.   Followup in 3 months.   Recommend to call us back if he has any question or any concern.  Roger Kath T., MD 06/06/2014

## 2014-09-06 ENCOUNTER — Ambulatory Visit (HOSPITAL_COMMUNITY): Payer: Medicare Other | Admitting: Psychiatry

## 2014-10-02 ENCOUNTER — Ambulatory Visit (HOSPITAL_COMMUNITY): Payer: Self-pay | Admitting: Psychiatry

## 2014-10-11 ENCOUNTER — Encounter (HOSPITAL_COMMUNITY): Payer: Self-pay | Admitting: Psychiatry

## 2014-10-11 ENCOUNTER — Ambulatory Visit (INDEPENDENT_AMBULATORY_CARE_PROVIDER_SITE_OTHER): Payer: Medicare Other | Admitting: Psychiatry

## 2014-10-11 VITALS — BP 140/67 | HR 78 | Ht 71.0 in | Wt 232.6 lb

## 2014-10-11 DIAGNOSIS — F200 Paranoid schizophrenia: Secondary | ICD-10-CM

## 2014-10-11 MED ORDER — RISPERIDONE 2 MG PO TABS
2.0000 mg | ORAL_TABLET | Freq: Every day | ORAL | Status: DC
Start: 2014-10-11 — End: 2015-01-11

## 2014-10-11 MED ORDER — TRAZODONE HCL 150 MG PO TABS: 150.0000 mg | ORAL_TABLET | Freq: Every day | ORAL | Status: DC

## 2014-10-11 MED ORDER — BENZTROPINE MESYLATE 0.5 MG PO TABS: 0.5000 mg | ORAL_TABLET | Freq: Every day | ORAL | Status: DC

## 2014-10-11 NOTE — Progress Notes (Signed)
[facility] [id number] Progress Note  Roger HerterGrady E Lopez [id number] 58 y.o.  10/11/2014 3:28 PM  Chief Complaint:  Medication management and followup.  History of Present Illness:  Roger PuttGrady came for his followup appointment.  Lately he has been very sad because he has multiple family members who died.  Patient told his cousin who died in [place] and he has another cousin who died because of long illness.  Sometimes he has difficulty falling asleep however he denies any irritability, anger, mood swings.  He denies any paranoia or any hallucination.  He likes his medication he wants to continue his current psychotropic medication.  He has no tremors or shakes.  He denied any suicidal thoughts or homicidal thoughts.  He is not interested in any grief counseling.  His appetite is okay.  His vitals are stable.  He is not drinking or using any illegal substances.  He lives with his wife who is very supportive.  He has 6 children and he has a good support from his family.  Suicidal Ideation: No Plan Formed: No Patient has means to carry out plan: No  Homicidal Ideation: No Plan Formed: No Patient has means to carry out plan: No  Medical History; Patient has history of back pain, hyperlipidemia and migraine headaches.  He sees Dr. Virgel ManifoldAva.    Review of Systems: Psychiatric: Agitation: No Hallucination: No Depressed Mood: No Insomnia: No Hypersomnia: No Altered Concentration: No Feels Worthless: No Grandiose Ideas: No Belief In Special Powers: No New/Increased Substance Abuse: No Compulsions: No  Neurologic: Headache: No Seizure: No Paresthesias: No    Outpatient Encounter Prescriptions as of 10/11/2014  Medication Sig  . atorvastatin (LIPITOR) 40 MG tablet Take 40 mg by mouth daily.   . benztropine (COGENTIN) 0.5 MG tablet Take 1 tablet (0.5 mg total) by mouth at bedtime.  Marland Kitchen. NEXIUM 40 MG capsule Take 40 mg by mouth daily.   . risperiDONE (RISPERDAL) 2 MG tablet Take 1 tablet (2  mg total) by mouth at bedtime.  . traZODone (DESYREL) 150 MG tablet Take 1 tablet (150 mg total) by mouth at bedtime.  . [DISCONTINUED] benztropine (COGENTIN) 0.5 MG tablet Take 1 tablet (0.5 mg total) by mouth at bedtime.  . [DISCONTINUED] risperiDONE (RISPERDAL) 2 MG tablet Take 1 tablet (2 mg total) by mouth at bedtime.  . [DISCONTINUED] traZODone (DESYREL) 150 MG tablet Take 1 tablet (150 mg total) by mouth at bedtime.    No results found for this or any previous visit (from the past 2160 hour(s)).  Past Psychiatric History/Hospitalization(s) Patient is seen in this office since 2005.  He has at least 3 psychiatric hospitalizations.  His last psychiatric admission was in 2011.  Patient has one suicidal attempt by taking overdose on his medication.  He has a history of psychosis paranoia and hallucinations Anxiety: No Bipolar Disorder: No Depression: Yes Mania: No Psychosis: Yes Schizophrenia: Yes Personality Disorder: No Hospitalization for psychiatric illness: Yes History of Electroconvulsive Shock Therapy: No Prior Suicide Attempts: Yes  Physical Exam: Constitutional:  BP 140/67  Pulse 78  Ht 5\' 11"  (1.803 m)  Wt 232 lb 9.6 oz (105.507 kg)  BMI 32.46 kg/m2  Musculoskeletal: Strength & Muscle Tone: within normal limits Gait & Station: normal Patient leans: N/A  Mental Status Examination;  Patient is casually dressed and fairly groomed.  He maintains fair eye contact.  He is pleasant and cooperative.  He denies any active or passive suicidal thoughts or homicidal thoughts.  He denied any auditory or  visual hallucination.  He described his mood sad and his affect is mood appropriate.  His attention and concentration is fair.  There were no delusions present at this time.  His fund of knowledge is adequate.  He is alert and oriented x3.  His insight judgment and impulse control is okay.  Established Problem, Stable/Improving (1), Review of Last Therapy Session (1) and Review  of New Medication or Change in Dosage (2)  Assessment: Axis I: Schizophrenia chronic paranoid type  Axis II: Deferred  Axis III:  Past Medical History  Diagnosis Date  . Hypercholesteremia   . Acid reflux   . Head ache   . Back pain     Axis IV: Mild   Plan:  I offered grief counseling but patient declined.  He wants to continue his current psychotropic medication.  I will continue Risperdal 2 mg at bedtime, trazodone 150 mg at bedtime and Cogentin 0.5 mg at bedtime.   Followup in 3 months.  Recommend to call Roger Lopez back if he has any question or any concern.  [name],[name], MD 10/11/2014

## 2014-12-09 ENCOUNTER — Emergency Department (HOSPITAL_COMMUNITY)
Admission: EM | Admit: 2014-12-09 | Discharge: 2014-12-09 | Disposition: A | Discharge status: Home / Self Care | Payer: Medicare Other | Attending: Emergency Medicine | Admitting: Emergency Medicine

## 2014-12-09 ENCOUNTER — Encounter (HOSPITAL_COMMUNITY): Payer: Self-pay | Admitting: *Deleted

## 2014-12-09 DIAGNOSIS — Z79899 Other long term (current) drug therapy: Secondary | ICD-10-CM | POA: Insufficient documentation

## 2014-12-09 DIAGNOSIS — M545 Low back pain: Secondary | ICD-10-CM | POA: Diagnosis present

## 2014-12-09 DIAGNOSIS — M5432 Sciatica, left side: Secondary | ICD-10-CM | POA: Diagnosis not present

## 2014-12-09 DIAGNOSIS — K219 Gastro-esophageal reflux disease without esophagitis: Secondary | ICD-10-CM | POA: Insufficient documentation

## 2014-12-09 DIAGNOSIS — G8929 Other chronic pain: Secondary | ICD-10-CM | POA: Diagnosis not present

## 2014-12-09 DIAGNOSIS — E780 Pure hypercholesterolemia: Secondary | ICD-10-CM | POA: Diagnosis not present

## 2014-12-09 MED ORDER — TRAMADOL HCL 50 MG PO TABS: 50.0000 mg | ORAL_TABLET | Freq: Four times a day (QID) | ORAL | Status: DC | PRN

## 2014-12-09 MED ORDER — CYCLOBENZAPRINE HCL 10 MG PO TABS: 10.0000 mg | ORAL_TABLET | Freq: Two times a day (BID) | ORAL | Status: DC | PRN

## 2014-12-09 NOTE — ED Notes (Signed)
Pt reports back pain r/t sciatica that has been worsening over the last few days, less mobile as well. Is followed by Dr. Ethelene Halamos, sts it is hard to get in to see him.

## 2014-12-09 NOTE — ED Provider Notes (Signed)
CSN: [id number]     Arrival date & time 12/09/14  1455 History  This chart was scribed for non-physician practitioner, Teressa LowerVrinda Mikael Debell, NP, working with Tilden FossaElizabeth Rees, MD, by Bronson CurbJacqueline Melvin, ED Scribe. This patient was seen in room WTR6/WTR6 and the patient's care was started at 3:30 PM.     Chief Complaint  Patient presents with  . Back Pain    The history is provided by the patient. No language interpreter was used.     HPI Comments: Roger Lopez is a 58 y.o. male who presents to the Emergency Department complaining of chronic right lower back pain that has gotten worse over the last few days. Patient reports history of sciatic nerve pain that is being treated by Dr. Ethelene Halamos. However, he reports Dr. Ethelene Halamos is constantly booked and he has been unable to get an appointment. He denies any recent falls, injury, or trauma. He states he has been receiving spinal injections with the last injection received approximately 2 years ago. He states the pain is worse with movement and relieved with rest. Patient has tried heating pads and Aleve (1 dose at 0300 today) with without significant improvement. He also reports receiving narcotic injections and taking Flexeril and Tramadol for similar back pain in the past with relief. He denies fever, chills, numbness/weakness, or bowel/bladder incontinence. NKA to medications.   Past Medical History  Diagnosis Date  . Hypercholesteremia   . Acid reflux   . Head ache   . Back pain    Past Surgical History  Procedure Laterality Date  . Tonsillectomy     Family History  Problem Relation Age of Onset  . Bipolar disorder Father   . Bipolar disorder Sister   . Bipolar disorder Sister    History  Substance Use Topics  . Smoking status: Never Smoker   . Smokeless tobacco: Not on file  . Alcohol Use: No    Review of Systems  Constitutional: Negative for fever and chills.  Musculoskeletal: Positive for back pain.  Neurological: Negative for  weakness and numbness.  All other systems reviewed and are negative.     Allergies  Review of patient's allergies indicates no known allergies.  Home Medications   Prior to Admission medications   Medication Sig Start Date End Date Taking? Authorizing Provider  atorvastatin (LIPITOR) 40 MG tablet Take 40 mg by mouth daily.  11/07/11   Historical Provider, MD  benztropine (COGENTIN) 0.5 MG tablet Take 1 tablet (0.5 mg total) by mouth at bedtime. 10/11/14 10/11/15  Cleotis NipperSyed T [name], MD  NEXIUM 40 MG capsule Take 40 mg by mouth daily.  10/11/11   Historical Provider, MD  risperiDONE (RISPERDAL) 2 MG tablet Take 1 tablet (2 mg total) by mouth at bedtime. 10/11/14 10/11/15  Cleotis NipperSyed T [name], MD  traZODone (DESYREL) 150 MG tablet Take 1 tablet (150 mg total) by mouth at bedtime. 10/11/14   Cleotis NipperSyed T [name], MD   Triage Vitals: BP 154/87 mmHg  Pulse 91  Temp(Src) 97.9 F (36.6 C) (Oral)  Resp 14  SpO2 99%  Physical Exam  Constitutional: He is oriented to person, place, and time. He appears well-developed and well-nourished. No distress.  HENT:  Head: Normocephalic and atraumatic.  Eyes: Conjunctivae and EOM are normal.  Neck: Neck supple. No tracheal deviation present.  Cardiovascular: Normal rate.   Pulmonary/Chest: Effort normal. No respiratory distress.  Musculoskeletal: Normal range of motion.  Left sciatic notch tenderness. Full rom  Neurological: He is alert and oriented to person,  place, and time.  Skin: Skin is warm and dry.  Psychiatric: He has a normal mood and affect. His behavior is normal.  Nursing note and vitals reviewed.   ED Course  Procedures (including critical care time)  DIAGNOSTIC STUDIES: Oxygen Saturation is 99% on room air, normal by my interpretation.    COORDINATION OF CARE: At 1535 Discussed treatment plan with patient. Patient agrees.   Labs Review Labs Reviewed - No data to display  Imaging Review No results found.   EKG Interpretation None       MDM   Final diagnoses:  Sciatica, left    Chronic symptoms that he is having a flare with. No redflags. Will treat with ultram and flexeril and pt can follow up with Dr. Ethelene Halamos  I personally performed the services described in this documentation, which was scribed in my presence. The recorded information has been reviewed and is accurate.    Teressa LowerVrinda Miliani Deike, NP 12/09/14 1547  Teressa LowerVrinda Lourine Alberico, NP 12/09/14 1615  Tilden FossaElizabeth Rees, MD 12/10/14 [phone]

## 2014-12-09 NOTE — Discharge Instructions (Signed)
Back Pain, Adult °Back pain is very common. The pain often gets better over time. The cause of back pain is usually not dangerous. Most people can learn to manage their back pain on their own.  °HOME CARE  °· Stay active. Start with short walks on flat ground if you can. Try to walk farther each day. °· Do not sit, drive, or stand in one place for more than 30 minutes. Do not stay in bed. °· Do not avoid exercise or work. Activity can help your back heal faster. °· Be careful when you bend or lift an object. Bend at your knees, keep the object close to you, and do not twist. °· Sleep on a firm mattress. Lie on your side, and bend your knees. If you lie on your back, put a pillow under your knees. °· Only take medicines as told by your doctor. °· Put ice on the injured area. °¨ Put ice in a plastic bag. °¨ Place a towel between your skin and the bag. °¨ Leave the ice on for 15-20 minutes, 03-04 times a day for the first 2 to 3 days. After that, you can switch between ice and heat packs. °· Ask your doctor about back exercises or massage. °· Avoid feeling anxious or stressed. Find good ways to deal with stress, such as exercise. °GET HELP RIGHT AWAY IF:  °· Your pain does not go away with rest or medicine. °· Your pain does not go away in 1 week. °· You have new problems. °· You do not feel well. °· The pain spreads into your legs. °· You cannot control when you poop (bowel movement) or pee (urinate). °· Your arms or legs feel weak or lose feeling (numbness). °· You feel sick to your stomach (nauseous) or throw up (vomit). °· You have belly (abdominal) pain. °· You feel like you may pass out (faint). °MAKE SURE YOU:  °· Understand these instructions. °· Will watch your condition. °· Will get help right away if you are not doing well or get worse. °Document Released: 06/02/2008 Document Revised: 03/08/2012 Document Reviewed: 04/18/2014 °[employer]® Patient Information ©2015 [employer]. This information is not intended  to replace advice given to you by your health care provider. Make sure you discuss any questions you have with your health care provider. ° °

## 2015-01-11 ENCOUNTER — Encounter (HOSPITAL_COMMUNITY): Payer: Self-pay | Admitting: Psychiatry

## 2015-01-11 ENCOUNTER — Ambulatory Visit (INDEPENDENT_AMBULATORY_CARE_PROVIDER_SITE_OTHER): Payer: Medicare Other | Admitting: Psychiatry

## 2015-01-11 VITALS — BP 152/80 | HR 81 | Ht 71.0 in | Wt 234.6 lb

## 2015-01-11 DIAGNOSIS — F200 Paranoid schizophrenia: Secondary | ICD-10-CM

## 2015-01-11 MED ORDER — TRAZODONE HCL 150 MG PO TABS: 150.0000 mg | ORAL_TABLET | Freq: Every day | ORAL | Status: DC

## 2015-01-11 MED ORDER — BENZTROPINE MESYLATE 0.5 MG PO TABS: 0.5000 mg | ORAL_TABLET | Freq: Every day | ORAL | Status: DC

## 2015-01-11 MED ORDER — RISPERIDONE 2 MG PO TABS: 2.0000 mg | ORAL_TABLET | Freq: Every day | ORAL | Status: DC

## 2015-01-11 NOTE — Progress Notes (Signed)
[facility] [id number] Progress Note  Roger Lopez E Esker [id number] 59 y.o.  01/11/2015 3:58 PM  Chief Complaint:  Medication management and followup.  History of Present Illness:  Roger PuttGrady came for his followup appointment.  He is taking his medication and denies any side effects.  He sleeping good.  He had a good Christmas.  He denies any paranoia, hallucination or any irritability.  He has no tremors or shakes.  His appetite is okay.  His vitals are stable.  He lives with his wife who is very supportive.  He is seeing his pastor on the regular basis and he had a offer from him to go [place] but he declined.  Patient was to continue his medication .  He denies drinking or using any illegal substances.  Suicidal Ideation: No Plan Formed: No Patient has means to carry out plan: No  Homicidal Ideation: No Plan Formed: No Patient has means to carry out plan: No  Medical History; Patient has history of back pain, hyperlipidemia and migraine headaches.  He sees Dr. Virgel ManifoldAva.    Review of Systems: Psychiatric: Agitation: No Hallucination: No Depressed Mood: No Insomnia: No Hypersomnia: No Altered Concentration: No Feels Worthless: No Grandiose Ideas: No Belief In Special Powers: No New/Increased Substance Abuse: No Compulsions: No  Neurologic: Headache: No Seizure: No Paresthesias: No    Outpatient Encounter Prescriptions as of 01/11/2015  Medication Sig  . atorvastatin (LIPITOR) 40 MG tablet Take 40 mg by mouth daily.   . benztropine (COGENTIN) 0.5 MG tablet Take 1 tablet (0.5 mg total) by mouth at bedtime.  . cyclobenzaprine (FLEXERIL) 10 MG tablet Take 1 tablet (10 mg total) by mouth 2 (two) times daily as needed for muscle spasms.  . naproxen sodium (ANAPROX) 220 MG tablet Take 220 mg by mouth 2 (two) times daily as needed (for pain).  Marland Kitchen. NEXIUM 40 MG capsule Take 40 mg by mouth daily.   . risperiDONE (RISPERDAL) 2 MG tablet Take 1 tablet (2 mg total) by mouth at bedtime.  .  tamsulosin (FLOMAX) 0.4 MG CAPS capsule   . terazosin (HYTRIN) 5 MG capsule   . traMADol (ULTRAM) 50 MG tablet Take 1 tablet (50 mg total) by mouth every 6 (six) hours as needed.  . traZODone (DESYREL) 150 MG tablet Take 1 tablet (150 mg total) by mouth at bedtime.  . [DISCONTINUED] benztropine (COGENTIN) 0.5 MG tablet Take 1 tablet (0.5 mg total) by mouth at bedtime.  . [DISCONTINUED] risperiDONE (RISPERDAL) 2 MG tablet Take 1 tablet (2 mg total) by mouth at bedtime.  . [DISCONTINUED] traZODone (DESYREL) 150 MG tablet Take 1 tablet (150 mg total) by mouth at bedtime.    No results found for this or any previous visit (from the past 2160 hour(s)).  Past Psychiatric History/Hospitalization(s) Patient is seen in this office since 2005.  He has at least 3 psychiatric hospitalizations.  His last psychiatric admission was in 2011.  Patient has one suicidal attempt by taking overdose on his medication.  He has a history of psychosis paranoia and hallucinations Anxiety: No Bipolar Disorder: No Depression: Yes Mania: No Psychosis: Yes Schizophrenia: Yes Personality Disorder: No Hospitalization for psychiatric illness: Yes History of Electroconvulsive Shock Therapy: No Prior Suicide Attempts: Yes  Physical Exam: Constitutional:  BP 152/80 mmHg  Pulse 81  Ht 5\' 11"  (1.803 m)  Wt 234 lb 9.6 oz (106.414 kg)  BMI 32.73 kg/m2  Musculoskeletal: Strength & Muscle Tone: within normal limits Gait & Station: normal Patient leans: N/A  Mental Status Examination;  Patient is casually dressed and fairly groomed.  He maintains fair eye contact.  He is pleasant and cooperative.  He denies any active or passive suicidal thoughts or homicidal thoughts.  He denied any auditory or visual hallucination.  He described his mood sad and his affect is mood appropriate.  His attention and concentration is fair.  He has no EPS or tremors.  There were no delusions present at this time.  His fund of knowledge is  adequate.  He is alert and oriented x3.  His insight judgment and impulse control is okay.  Established Problem, Stable/Improving (1), Review of Last Therapy Session (1) and Review of New Medication or Change in Dosage (2)  Assessment: Axis I: Schizophrenia chronic paranoid type  Axis II: Deferred  Axis III:  Past Medical History  Diagnosis Date  . Hypercholesteremia   . Acid reflux   . Head ache   . Back pain     Axis IV: Mild   Plan:  Patient is doing better on his current medication.  I will continue Risperdal 2 mg at bedtime, trazodone 150 mg at bedtime and Cogentin 0.5 mg at bedtime.   Patient wants his prescription to be sent at [name] [place].  Followup in 3 months.  Recommend to call us back if he has any question or any concern.  Lopez,Roger T., MD 01/11/2015

## 2015-04-12 ENCOUNTER — Encounter (HOSPITAL_COMMUNITY): Payer: Self-pay | Admitting: Psychiatry

## 2015-04-12 ENCOUNTER — Ambulatory Visit (INDEPENDENT_AMBULATORY_CARE_PROVIDER_SITE_OTHER): Payer: Medicare Other | Admitting: Psychiatry

## 2015-04-12 VITALS — BP 134/83 | HR 74 | Ht 71.0 in | Wt 228.8 lb

## 2015-04-12 DIAGNOSIS — F200 Paranoid schizophrenia: Secondary | ICD-10-CM | POA: Diagnosis not present

## 2015-04-12 MED ORDER — RISPERIDONE 2 MG PO TABS: 2.0000 mg | ORAL_TABLET | Freq: Every day | ORAL | Status: DC

## 2015-04-12 MED ORDER — BENZTROPINE MESYLATE 0.5 MG PO TABS: 0.5000 mg | ORAL_TABLET | Freq: Every day | ORAL | Status: DC

## 2015-04-12 MED ORDER — TRAZODONE HCL 150 MG PO TABS: 150.0000 mg | ORAL_TABLET | Freq: Every day | ORAL | Status: DC

## 2015-04-12 NOTE — Progress Notes (Signed)
[facility] [id number] Progress Note  Roger Lopez [id number] 59 y.o.  04/12/2015 4:11 PM  Chief Complaint:  Medication management and followup.  History of Present Illness:  Roger PuttGrady came for his followup appointment.  Last week he was having a severe migraine headaches and he was seen primary care physician and given medication.  He is feeling much better.  He is taking his Risperdal Cogentin and trazodone and denies any paranoia or any hallucination.  His mood has been stable.  He denies any irritability or any anger.  His appetite is okay.  He lives with his wife who is very supportive.  Patient denies drinking or using any illegal substances.  Suicidal Ideation: No Plan Formed: No Patient has means to carry out plan: No  Homicidal Ideation: No Plan Formed: No Patient has means to carry out plan: No  Medical History; Patient has history of back pain, hyperlipidemia and migraine headaches.  He sees Dr. Virgel ManifoldAva.    Review of Systems: Psychiatric: Agitation: No Hallucination: No Depressed Mood: No Insomnia: No Hypersomnia: No Altered Concentration: No Feels Worthless: No Grandiose Ideas: No Belief In Special Powers: No New/Increased Substance Abuse: No Compulsions: No  Neurologic: Headache: No Seizure: No Paresthesias: No    Outpatient Encounter Prescriptions as of 04/12/2015  Medication Sig  . atorvastatin (LIPITOR) 40 MG tablet Take 40 mg by mouth daily.   . benztropine (COGENTIN) 0.5 MG tablet Take 1 tablet (0.5 mg total) by mouth at bedtime.  . naproxen sodium (ANAPROX) 220 MG tablet Take 220 mg by mouth 2 (two) times daily as needed (for pain).  Marland Kitchen. NEXIUM 40 MG capsule Take 40 mg by mouth daily.   . risperiDONE (RISPERDAL) 2 MG tablet Take 1 tablet (2 mg total) by mouth at bedtime.  Marland Kitchen. terazosin (HYTRIN) 5 MG capsule   . traMADol (ULTRAM) 50 MG tablet Take 1 tablet (50 mg total) by mouth every 6 (six) hours as needed.  . traZODone (DESYREL) 150 MG tablet Take  1 tablet (150 mg total) by mouth at bedtime.  . [DISCONTINUED] benztropine (COGENTIN) 0.5 MG tablet Take 1 tablet (0.5 mg total) by mouth at bedtime.  . [DISCONTINUED] cyclobenzaprine (FLEXERIL) 10 MG tablet Take 1 tablet (10 mg total) by mouth 2 (two) times daily as needed for muscle spasms.  . [DISCONTINUED] risperiDONE (RISPERDAL) 2 MG tablet Take 1 tablet (2 mg total) by mouth at bedtime.  . [DISCONTINUED] tamsulosin (FLOMAX) 0.4 MG CAPS capsule   . [DISCONTINUED] traZODone (DESYREL) 150 MG tablet Take 1 tablet (150 mg total) by mouth at bedtime.    No results found for this or any previous visit (from the past 2160 hour(s)).  Past Psychiatric History/Hospitalization(s) Patient is seen in this office since 2005.  He has at least 3 psychiatric hospitalizations.  His last psychiatric admission was in 2011.  Patient has one suicidal attempt by taking overdose on his medication.  He has a history of psychosis paranoia and hallucinations Anxiety: No Bipolar Disorder: No Depression: Yes Mania: No Psychosis: Yes Schizophrenia: Yes Personality Disorder: No Hospitalization for psychiatric illness: Yes History of Electroconvulsive Shock Therapy: No Prior Suicide Attempts: Yes  Physical Exam: Constitutional:  BP 134/83 mmHg  Pulse 74  Ht 5\' 11"  (1.803 m)  Wt 228 lb 12.8 oz (103.783 kg)  BMI 31.93 kg/m2  Musculoskeletal: Strength & Muscle Tone: within normal limits Gait & Station: normal Patient leans: N/A  Mental Status Examination;  Patient is casually dressed and fairly groomed.  He maintains fair  eye contact.  He is pleasant and cooperative.  He denies any active or passive suicidal thoughts or homicidal thoughts.  He denied any auditory or visual hallucination.  He described his mood euthymic and his affect is appropriate.  His attention and concentration is fair.  He has no EPS or tremors.  There were no delusions present at this time.  His fund of knowledge is adequate.  He is  alert and oriented x3.  His insight judgment and impulse control is okay.  Established Problem, Stable/Improving (1), Review of Last Therapy Session (1) and Review of New Medication or Change in Dosage (2)  Assessment: Axis I: Schizophrenia chronic paranoid type  Axis II: Deferred  Axis III:  Past Medical History  Diagnosis Date  . Hypercholesteremia   . Acid reflux   . Head ache   . Back pain    Plan:  Patient is doing better on his current medication.  I will continue Risperdal 2 mg at bedtime, trazodone 150 mg at bedtime and Cogentin 0.5 mg at bedtime.   He has no tremors or shakes.  Followup in 3 months.  Recommend to call us back if he has any question or any concern.  Katlin Bortner T., MD 04/12/2015

## 2015-07-12 ENCOUNTER — Ambulatory Visit (HOSPITAL_COMMUNITY): Payer: Self-pay | Admitting: Psychiatry

## 2015-07-16 ENCOUNTER — Other Ambulatory Visit (HOSPITAL_COMMUNITY): Payer: Self-pay | Admitting: Psychiatry

## 2015-07-16 DIAGNOSIS — F200 Paranoid schizophrenia: Secondary | ICD-10-CM

## 2015-07-17 NOTE — Telephone Encounter (Signed)
One time refill of patient's prescribed Risperdal approved by Dr. Lolly MustacheArfeen due to patient's last order e-scribed when seen on 04/12/15 plus 2 refills and patient's appointment from 07/12/15 had to be moved to 07/25/15 due to Dr. Lolly MustacheArfeen out of town at that time.  One time new order e-scribed to [facility]'s [facility] [place] [address]

## 2015-07-25 ENCOUNTER — Ambulatory Visit (INDEPENDENT_AMBULATORY_CARE_PROVIDER_SITE_OTHER): Payer: Medicare Other | Admitting: Psychiatry

## 2015-07-25 ENCOUNTER — Encounter (HOSPITAL_COMMUNITY): Payer: Self-pay | Admitting: Psychiatry

## 2015-07-25 VITALS — BP 132/73 | HR 83 | Ht 71.0 in | Wt 229.8 lb

## 2015-07-25 DIAGNOSIS — F200 Paranoid schizophrenia: Secondary | ICD-10-CM | POA: Diagnosis not present

## 2015-07-25 MED ORDER — RISPERIDONE 2 MG PO TABS: 2.0000 mg | ORAL_TABLET | Freq: Every day | ORAL | Status: DC

## 2015-07-25 MED ORDER — BENZTROPINE MESYLATE 0.5 MG PO TABS: 0.5000 mg | ORAL_TABLET | Freq: Every day | ORAL | Status: DC

## 2015-07-25 MED ORDER — TRAZODONE HCL 150 MG PO TABS: 150.0000 mg | ORAL_TABLET | Freq: Every day | ORAL | Status: DC

## 2015-07-25 NOTE — Progress Notes (Signed)
[facility] [id number] Progress Note  Roger Lopez [id number] 59 y.o.  07/25/2015 3:26 PM  Chief Complaint:  Medication management and followup.  History of Present Illness:  Roger Lopez came for his followup appointment.  He is taking his medication as prescribed.  He is happy that he is no longer having migraine headaches.  He sleeping good.  He denies any paranoia, dreams or any flashback.  He mentioned his wife also reported improvement in his behavior and paranoia.  He wants to continue his current psycho topic medication.  He has no tremors, shakes or any side effects.  He denies drinking or using any illegal substances.  His appetite is okay.  His vitals are stable.  Patient lives with his wife who is very supportive.  Suicidal Ideation: No Plan Formed: No Patient has means to carry out plan: No  Homicidal Ideation: No Plan Formed: No Patient has means to carry out plan: No  Medical History; Patient has history of back pain, hyperlipidemia and migraine headaches.  He sees Dr. Virgel Manifold.    Review of Systems: Psychiatric: Agitation: No Hallucination: No Depressed Mood: No Insomnia: No Hypersomnia: No Altered Concentration: No Feels Worthless: No Grandiose Ideas: No Belief In Special Powers: No New/Increased Substance Abuse: No Compulsions: No  Neurologic: Headache: No Seizure: No Paresthesias: No    Outpatient Encounter Prescriptions as of 07/25/2015  Medication Sig  . atorvastatin (LIPITOR) 40 MG tablet Take 40 mg by mouth daily.   . benztropine (COGENTIN) 0.5 MG tablet Take 1 tablet (0.5 mg total) by mouth at bedtime.  . naproxen sodium (ANAPROX) 220 MG tablet Take 220 mg by mouth 2 (two) times daily as needed (for pain).  Marland Kitchen NEXIUM 40 MG capsule Take 40 mg by mouth daily.   . risperiDONE (RISPERDAL) 2 MG tablet Take 1 tablet (2 mg total) by mouth at bedtime.  . tamsulosin (FLOMAX) 0.4 MG CAPS capsule TAKE 1 CAPSULE BEDTIME  . terazosin (HYTRIN) 5 MG capsule   .  traMADol (ULTRAM) 50 MG tablet Take 1 tablet (50 mg total) by mouth every 6 (six) hours as needed.  . traZODone (DESYREL) 150 MG tablet Take 1 tablet (150 mg total) by mouth at bedtime.  . [DISCONTINUED] benztropine (COGENTIN) 0.5 MG tablet Take 1 tablet (0.5 mg total) by mouth at bedtime.  . [DISCONTINUED] risperiDONE (RISPERDAL) 2 MG tablet TAKE 1 TABLET (2 MG TOTAL) BY MOUTH AT BEDTIME.  . [DISCONTINUED] traZODone (DESYREL) 150 MG tablet Take 1 tablet (150 mg total) by mouth at bedtime.   No facility-administered encounter medications on file as of 07/25/2015.    No results found for this or any previous visit (from the past 2160 hour(s)).  Past Psychiatric History/Hospitalization(s) Patient is seen in this office since 2005.  He has at least 3 psychiatric hospitalizations.  His last psychiatric admission was in 2011.  Patient has one suicidal attempt by taking overdose on his medication.  He has a history of psychosis paranoia and hallucinations Anxiety: No Bipolar Disorder: No Depression: Yes Mania: No Psychosis: Yes Schizophrenia: Yes Personality Disorder: No Hospitalization for psychiatric illness: Yes History of Electroconvulsive Shock Therapy: No Prior Suicide Attempts: Yes  Physical Exam: Constitutional:  BP 132/73 mmHg  Pulse 83  Ht  (1.803 m)  Wt 229 lb 12.8 oz (104.237 kg)  BMI 32.06 kg/m2  Musculoskeletal: Strength & Muscle Tone: within normal limits Gait & Station: normal Patient leans: N/A  Mental Status Examination;  Patient is casually dressed and fairly groomed.  He maintains fair eye contact.  He is pleasant and cooperative.  He denies any active or passive suicidal thoughts or homicidal thoughts.  He denied any auditory or visual hallucination.  He described his mood euthymic and his affect is appropriate.  His attention and concentration is fair.  He has no EPS or tremors.  There were no delusions present at this time.  His fund of knowledge is  adequate.  He is alert and oriented x3.  His insight judgment and impulse control is okay.  Established Problem, Stable/Improving (1), Review of Last Therapy Session (1) and Review of New Medication or Change in Dosage (2)  Assessment: Axis I: Schizophrenia chronic paranoid type  Axis II: Deferred  Axis III:  Past Medical History  Diagnosis Date  . Hypercholesteremia   . Acid reflux   . Head ache   . Back pain    Plan:  Patient is doing better on his current medication.  He has no side effects.  I will continue Risperdal 2 mg at bedtime, trazodone 150 mg at bedtime and Cogentin 0.5 mg at bedtime.  Followup in 3 months.  Recommend to call us back if he has any question or any concern.  [name],[name], MD 07/25/2015

## 2015-08-15 ENCOUNTER — Emergency Department (HOSPITAL_COMMUNITY)
Admission: EM | Admit: 2015-08-15 | Discharge: 2015-08-15 | Disposition: A | Discharge status: Home / Self Care | Payer: No Typology Code available for payment source | Attending: Emergency Medicine | Admitting: Emergency Medicine

## 2015-08-15 ENCOUNTER — Emergency Department (HOSPITAL_COMMUNITY): Payer: No Typology Code available for payment source

## 2015-08-15 ENCOUNTER — Encounter (HOSPITAL_COMMUNITY): Payer: Self-pay | Admitting: Emergency Medicine

## 2015-08-15 DIAGNOSIS — E780 Pure hypercholesterolemia: Secondary | ICD-10-CM | POA: Diagnosis not present

## 2015-08-15 DIAGNOSIS — S161XXA Strain of muscle, fascia and tendon at neck level, initial encounter: Secondary | ICD-10-CM | POA: Insufficient documentation

## 2015-08-15 DIAGNOSIS — Z79899 Other long term (current) drug therapy: Secondary | ICD-10-CM | POA: Diagnosis not present

## 2015-08-15 DIAGNOSIS — S20211A Contusion of right front wall of thorax, initial encounter: Secondary | ICD-10-CM | POA: Insufficient documentation

## 2015-08-15 DIAGNOSIS — S199XXA Unspecified injury of neck, initial encounter: Secondary | ICD-10-CM | POA: Diagnosis present

## 2015-08-15 DIAGNOSIS — Y9389 Activity, other specified: Secondary | ICD-10-CM | POA: Insufficient documentation

## 2015-08-15 DIAGNOSIS — Y92410 Unspecified street and highway as the place of occurrence of the external cause: Secondary | ICD-10-CM | POA: Insufficient documentation

## 2015-08-15 DIAGNOSIS — Y998 Other external cause status: Secondary | ICD-10-CM | POA: Diagnosis not present

## 2015-08-15 DIAGNOSIS — K219 Gastro-esophageal reflux disease without esophagitis: Secondary | ICD-10-CM | POA: Diagnosis not present

## 2015-08-15 DIAGNOSIS — S0990XA Unspecified injury of head, initial encounter: Secondary | ICD-10-CM | POA: Diagnosis not present

## 2015-08-15 MED ORDER — HYDROCODONE-ACETAMINOPHEN 7.5-325 MG/15ML PO SOLN: 10.0000 mL | Freq: Four times a day (QID) | ORAL | Status: DC | PRN

## 2015-08-15 MED ORDER — ONDANSETRON 4 MG PO TBDP
4.0000 mg | ORAL_TABLET | Freq: Once | ORAL | Status: AC
  Administered 2015-08-15: 4 mg via ORAL
  Filled 2015-08-15: qty 1

## 2015-08-15 MED ORDER — HYDROCODONE-ACETAMINOPHEN 7.5-325 MG/15ML PO SOLN
10.0000 mL | Freq: Once | ORAL | Status: AC
  Administered 2015-08-15: 10 mL via ORAL
  Filled 2015-08-15: qty 15

## 2015-08-15 MED ORDER — IBUPROFEN 600 MG PO TABS: 600.0000 mg | ORAL_TABLET | Freq: Four times a day (QID) | ORAL | Status: AC | PRN
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
## 2015-08-15 NOTE — Discharge Instructions (Signed)
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
## 2015-08-17 ENCOUNTER — Emergency Department (INDEPENDENT_AMBULATORY_CARE_PROVIDER_SITE_OTHER)
Admission: EM | Admit: 2015-08-17 | Discharge: 2015-08-17 | Disposition: A | Discharge status: Home / Self Care | Payer: Self-pay | Source: Home / Self Care | Attending: Emergency Medicine | Admitting: Emergency Medicine

## 2015-08-17 ENCOUNTER — Encounter (HOSPITAL_COMMUNITY): Payer: Self-pay

## 2015-08-17 DIAGNOSIS — S060X1D Concussion with loss of consciousness of 30 minutes or less, subsequent encounter: Secondary | ICD-10-CM

## 2015-08-17 DIAGNOSIS — R519 Headache, unspecified: Secondary | ICD-10-CM

## 2015-08-17 DIAGNOSIS — R51 Headache: Secondary | ICD-10-CM

## 2015-08-17 MED ORDER — KETOROLAC TROMETHAMINE 30 MG/ML IJ SOLN
30.0000 mg | Freq: Once | INTRAMUSCULAR | Status: AC
  Administered 2015-08-17: 30 mg via INTRAMUSCULAR

## 2015-08-17 MED ORDER — KETOROLAC TROMETHAMINE 30 MG/ML IJ SOLN
INTRAMUSCULAR | Status: AC
  Filled 2015-08-17: qty 1
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
## 2015-08-22 ENCOUNTER — Ambulatory Visit: Payer: Medicare Other | Admitting: Diagnostic Neuroimaging

## 2015-08-23 ENCOUNTER — Encounter: Payer: Self-pay | Admitting: Diagnostic Neuroimaging

## 2015-08-27 ENCOUNTER — Encounter: Payer: Self-pay | Admitting: Neurology

## 2015-08-27 ENCOUNTER — Ambulatory Visit (INDEPENDENT_AMBULATORY_CARE_PROVIDER_SITE_OTHER): Payer: Medicare Other | Admitting: Neurology

## 2015-08-27 VITALS — BP 140/80 | HR 87 | Ht 71.0 in | Wt 233.0 lb

## 2015-08-27 DIAGNOSIS — R51 Headache: Secondary | ICD-10-CM

## 2015-08-27 DIAGNOSIS — G44309 Post-traumatic headache, unspecified, not intractable: Secondary | ICD-10-CM

## 2015-08-27 DIAGNOSIS — F0781 Postconcussional syndrome: Secondary | ICD-10-CM | POA: Insufficient documentation

## 2015-08-27 DIAGNOSIS — R519 Headache, unspecified: Secondary | ICD-10-CM | POA: Insufficient documentation

## 2015-08-27 MED ORDER — NORTRIPTYLINE HCL 10 MG PO CAPS: 10.0000 mg | ORAL_CAPSULE | Freq: Every day | ORAL | Status: AC
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
## 2015-08-27 NOTE — Patient Instructions (Signed)
1.  I will start you on nortriptyline 10mg  at bedtime.  CALL IN 4 WEEKS WITH UPDATE AND WE CAN INCREASE DOSE IF NEEDED. 2.  Stop ibuprofen.  Instead, try naproxen  every 12 hours as needed.  But limit use (as well as all pain relievers) to no more than 2 days out of th week 3.  Follow up in 3 months, but CALL IN 4 WEEKS

## 2015-10-01 ENCOUNTER — Telehealth: Payer: Self-pay | Admitting: Neurology

## 2015-10-01 NOTE — Telephone Encounter (Signed)
Tried to call patient back. L/M for him that I will cancel his appointment that was scheduled on 11/30 and that he would have to sign a form to receive his records in Medical Records  Left address to medical records on his voicemail

## 2015-10-01 NOTE — Telephone Encounter (Signed)
Called medical records at [facility], Waiting on a call back

## 2015-10-01 NOTE — Telephone Encounter (Signed)
PT called and wanted to cancel his appointment with Dr Everlena Cooper and wanted his medical records faxed to him/Dawn CB# [phone]
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
## 2015-10-25 ENCOUNTER — Ambulatory Visit (HOSPITAL_COMMUNITY): Payer: Self-pay | Admitting: Psychiatry

## 2015-11-15 ENCOUNTER — Ambulatory Visit (HOSPITAL_COMMUNITY): Payer: Self-pay | Admitting: Psychiatry

## 2015-11-28 ENCOUNTER — Ambulatory Visit: Payer: Self-pay | Admitting: Neurology

## 2016-01-21 ENCOUNTER — Ambulatory Visit (HOSPITAL_COMMUNITY): Payer: Self-pay | Admitting: Psychiatry

## 2016-01-31 ENCOUNTER — Encounter (HOSPITAL_COMMUNITY): Payer: Self-pay | Admitting: Psychiatry

## 2016-01-31 ENCOUNTER — Ambulatory Visit (INDEPENDENT_AMBULATORY_CARE_PROVIDER_SITE_OTHER): Payer: Medicare Other | Admitting: Psychiatry

## 2016-01-31 VITALS — BP 132/82 | HR 73 | Ht 71.0 in | Wt 241.8 lb

## 2016-01-31 DIAGNOSIS — F200 Paranoid schizophrenia: Secondary | ICD-10-CM

## 2016-01-31 MED ORDER — RISPERIDONE 2 MG PO TABS: 2.0000 mg | ORAL_TABLET | Freq: Every day | ORAL | Status: DC

## 2016-01-31 NOTE — Progress Notes (Signed)
Roger Lopez [id number] Progress Note  Roger Lopez [id number] 60 y.o.  01/31/2016 4:49 PM  Chief Complaint:  I missed appointment because I have a lot of deaths in the family.  Discuss was very sad.    History of Present Illness:  Roger Lopez came for his followup appointment.  He missed appointment because he has multiple death in the family.  His brother-in-law died unexpected and soon after his sisters daughter died in car accident and she was [redacted] weeks pregnant.  Patient told it was a difficult time for the family.  He was very busy with them.  However he was taking his medication as prescribed.  He endorsed that he was feeling sad depressed and tearful with things don't getting better.  He endorse Christmas was very difficult for them.  However he is taking his medication as prescribed and do not have any side effects.  He is no longer taking trazodone.  His neurologist prescribed nortriptyline for his headache and he sleeping better.  He continues to have back pain at times which he takes tramadol as needed.  He denies any paranoia or any hallucination.  He likes Risperdal which is helping his hallucinations.  His appetite is okay.  His vitals are stable.  He denies any feeling of hopelessness or worthlessness.  He lives with his wife who is very supportive.  Suicidal Ideation: No Plan Formed: No Patient has means to carry out plan: No  Homicidal Ideation: No Plan Formed: No Patient has means to carry out plan: No  Medical History; Patient has history of back pain, hyperlipidemia and migraine headaches.  He sees Dr. Virgel Manifold.    Review of Systems: Psychiatric: Agitation: No Hallucination: No Depressed Mood: No Insomnia: No Hypersomnia: No Altered Concentration: No Feels Worthless: No Grandiose Ideas: No Belief In Special Powers: No New/Increased Substance Abuse: No Compulsions: No  Neurologic: Headache: No Seizure: No Paresthesias: No    Outpatient Encounter Prescriptions  as of 01/31/2016  Medication Sig  . atorvastatin (LIPITOR) 40 MG tablet Take 40 mg by mouth at bedtime.   Marland Kitchen ibuprofen (ADVIL,MOTRIN) 600 MG tablet Take 1 tablet (600 mg total) by mouth every 6 (six) hours as needed.  . naproxen sodium (ANAPROX) 220 MG tablet Take 220 mg by mouth 2 (two) times daily as needed (for pain).  Marland Kitchen NEXIUM 40 MG capsule Take 40 mg by mouth daily.   . nortriptyline (PAMELOR) 10 MG capsule Take 1 capsule (10 mg total) by mouth at bedtime.  . risperiDONE (RISPERDAL) 2 MG tablet Take 1 tablet (2 mg total) by mouth at bedtime.  . [DISCONTINUED] risperiDONE (RISPERDAL) 2 MG tablet Take 1 tablet (2 mg total) by mouth at bedtime.   No facility-administered encounter medications on file as of 01/31/2016.    No results found for this or any previous visit (from the past 2160 hour(s)).  Past Psychiatric History/Hospitalization(s) Patient is seen in this office since 2005.  He has at least 3 psychiatric hospitalizations.  His last psychiatric admission was in 2011.  Patient has one suicidal attempt by taking overdose on his medication.  He has a history of psychosis paranoia and hallucinations Anxiety: No Bipolar Disorder: No Depression: Yes Mania: No Psychosis: Yes Schizophrenia: Yes Personality Disorder: No Hospitalization for psychiatric illness: Yes History of Electroconvulsive Shock Therapy: No Prior Suicide Attempts: Yes  Physical Exam: Constitutional:  BP 132/82 mmHg  Pulse 73  Ht  (1.803 m)  Wt 241 lb 12.8 oz (109.68 kg)  BMI 33.74  kg/m2  Musculoskeletal: Strength & Muscle Tone: within normal limits Gait & Station: normal Patient leans: N/A  Mental Status Examination;  Patient is casually dressed and fairly groomed.  He maintains fair eye contact.  He is pleasant and cooperative.  He denies any active or passive suicidal thoughts or homicidal thoughts.  He denied any auditory or visual hallucination.  He described his mood euthymic and his affect is  appropriate.  His attention and concentration is fair.  He has no EPS or tremors.  There were no delusions present at this time.  His fund of knowledge is adequate.  He is alert and oriented x3.  His insight judgment and impulse control is okay.  Established Problem, Stable/Improving (1), Review of Last Therapy Session (1) and Review of New Medication or Change in Dosage (2)  Assessment: Axis I: Schizophrenia chronic paranoid type  Axis II: Deferred  Axis III:  Past Medical History  Diagnosis Date  . Hypercholesteremia   . Acid reflux   . Head ache   . Back pain    Plan:  Patient is a stable on his current psychiatric medication .  Offer grief counseling but patient declined.  He has no side effects from Risperdal.  I will continue Risperdal 2 mg at bedtime.  He is getting nortriptyline for headache from his neurologist.  Recommended to call us back if he has any question or any concern.  Follow-up in 3 months.  Illene Sweeting T., MD 01/31/2016

## 2016-02-04 ENCOUNTER — Emergency Department (HOSPITAL_COMMUNITY)
Admission: EM | Admit: 2016-02-04 | Discharge: 2016-02-05 | Disposition: A | Discharge status: Home / Self Care | Payer: Medicare Other | Attending: Emergency Medicine | Admitting: Emergency Medicine

## 2016-02-04 ENCOUNTER — Encounter (HOSPITAL_COMMUNITY): Payer: Self-pay | Admitting: Emergency Medicine

## 2016-02-04 DIAGNOSIS — E7800 Pure hypercholesterolemia, unspecified: Secondary | ICD-10-CM | POA: Insufficient documentation

## 2016-02-04 DIAGNOSIS — K219 Gastro-esophageal reflux disease without esophagitis: Secondary | ICD-10-CM | POA: Diagnosis not present

## 2016-02-04 DIAGNOSIS — G8929 Other chronic pain: Secondary | ICD-10-CM | POA: Diagnosis not present

## 2016-02-04 DIAGNOSIS — Z79899 Other long term (current) drug therapy: Secondary | ICD-10-CM | POA: Diagnosis not present

## 2016-02-04 DIAGNOSIS — M545 Low back pain: Secondary | ICD-10-CM | POA: Insufficient documentation

## 2016-02-04 MED ORDER — HYDROMORPHONE HCL 1 MG/ML IJ SOLN
1.0000 mg | Freq: Once | INTRAMUSCULAR | Status: AC
Start: 2016-02-04 — End: 2016-02-05
  Administered 2016-02-05: 1 mg via INTRAMUSCULAR
  Filled 2016-02-04: qty 1

## 2016-02-04 NOTE — ED Notes (Signed)
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 Tramadol and aleve have not helped. Alert and oriented.

## 2016-02-04 NOTE — Discharge Instructions (Signed)
Back Exercises °If you have pain in your back, do these exercises 2-3 times each day or as told by your doctor. When the pain goes away, do the exercises once each day, but repeat the steps more times for each exercise (do more repetitions). If you do not have pain in your back, do these exercises once each day or as told by your doctor. °EXERCISES °Single Knee to Chest °Do these steps 3-5 times in a row for each leg: °1. Lie on your back on a firm bed or the floor with your legs stretched out. °2. Bring one knee to your chest. °3. Hold your knee to your chest by grabbing your knee or thigh. °4. Pull on your knee until you feel a gentle stretch in your lower back. °5. Keep doing the stretch for 10-30 seconds. °6. Slowly let go of your leg and straighten it. °Pelvic Tilt °Do these steps 5-10 times in a row: °1. Lie on your back on a firm bed or the floor with your legs stretched out. °2. Bend your knees so they point up to the ceiling. Your feet should be flat on the floor. °3. Tighten your lower belly (abdomen) muscles to press your lower back against the floor. This will make your tailbone point up to the ceiling instead of pointing down to your feet or the floor. °4. Stay in this position for 5-10 seconds while you gently tighten your muscles and breathe evenly. °Cat-Cow °Do these steps until your lower back bends more easily: °1. Get on your hands and knees on a firm surface. Keep your hands under your shoulders, and keep your knees under your hips. You may put padding under your knees. °2. Let your head hang down, and make your tailbone point down to the floor so your lower back is round like the back of a cat. °3. Stay in this position for 5 seconds. °4. Slowly lift your head and make your tailbone point up to the ceiling so your back hangs low (sags) like the back of a cow. °5. Stay in this position for 5 seconds. °Press-Ups °Do these steps 5-10 times in a row: °1. Lie on your belly (face-down) on the  floor. °2. Place your hands near your head, about shoulder-width apart. °3. While you keep your back relaxed and keep your hips on the floor, slowly straighten your arms to raise the top half of your body and lift your shoulders. Do not use your back muscles. To make yourself more comfortable, you may change where you place your hands. °4. Stay in this position for 5 seconds. °5. Slowly return to lying flat on the floor. °Bridges °Do these steps 10 times in a row: °1. Lie on your back on a firm surface. °2. Bend your knees so they point up to the ceiling. Your feet should be flat on the floor. °3. Tighten your butt muscles and lift your butt off of the floor until your waist is almost as high as your knees. If you do not feel the muscles working in your butt and the back of your thighs, slide your feet 1-2 inches farther away from your butt. °4. Stay in this position for 3-5 seconds. °5. Slowly lower your butt to the floor, and let your butt muscles relax. °If this exercise is too easy, try doing it with your arms crossed over your chest. °Belly Crunches °Do these steps 5-10 times in a row: °1. Lie on your back on a firm bed   or the floor with your legs stretched out. 2. Bend your knees so they point up to the ceiling. Your feet should be flat on the floor. 3. Cross your arms over your chest. 4. Tip your chin a little bit toward your chest but do not bend your neck. 5. Tighten your belly muscles and slowly raise your chest just enough to lift your shoulder blades a tiny bit off of the floor. 6. Slowly lower your chest and your head to the floor. Back Lifts Do these steps 5-10 times in a row: 1. Lie on your belly (face-down) with your arms at your sides, and rest your forehead on the floor. 2. Tighten the muscles in your legs and your butt. 3. Slowly lift your chest off of the floor while you keep your hips on the floor. Keep the back of your head in line with the curve in your back. Look at the floor while  you do this. 4. Stay in this position for 3-5 seconds. 5. Slowly lower your chest and your face to the floor. GET HELP IF:  Your back pain gets a lot worse when you do an exercise.  Your back pain does not lessen 2 hours after you exercise. If you have any of these problems, stop doing the exercises. Do not do them again unless your doctor says it is okay. GET HELP RIGHT AWAY IF:  You have sudden, very bad back pain. If this happens, stop doing the exercises. Do not do them again unless your doctor says it is okay.   This information is not intended to replace advice given to you by your health care provider. Make sure you discuss any questions you have with your health care provider.   Document Released: 01/17/2011 Document Revised: 09/05/2015 Document Reviewed: 02/08/2015 Elsevier Interactive Patient Education 2016 [employer]. Make sure to see your PCP today

## 2016-02-04 NOTE — ED Provider Notes (Signed)
CSN: [id number]     Arrival date & time 02/04/16  2245 History  By signing my name below, I, Phillis Haggis, attest that this documentation has been prepared under the direction and in the presence of Earley Favor, NP-C. Electronically Signed: Phillis Haggis, ED Scribe. 02/04/2016. 11:37 PM.   Chief Complaint  Patient presents with  . Back Pain   The history is provided by the patient. No language interpreter was used.  HPI Comments: Roger Lopez is a 60 y.o. Male with a hx of chronic back pain who presents to the Emergency Department complaining of lower back pain that radiates down the back of both of his legs onset 2 weeks ago. He states that he has not had this pain in 4 years ago but reinjured his back the other day. He reports worsening pain with palpation and walking. Pt has tried Tramadol, heating pad, and Aleve to no relief. He states that the Tramadol constipates him, and worsens the pain further. Pt has not seen his PCP for this recent pain. Pt states that he refuses to take steroids or muscle relaxants, for the fact that they constipate him. He denies numbness or weakness.   Past Medical History  Diagnosis Date  . Hypercholesteremia   . Acid reflux   . Head ache   . Back pain    Past Surgical History  Procedure Laterality Date  . Tonsillectomy     Family History  Problem Relation Age of Onset  . Bipolar disorder Father   . Bipolar disorder Sister   . Bipolar disorder Sister    Social History  Substance Use Topics  . Smoking status: Never Smoker   . Smokeless tobacco: Never Used  . Alcohol Use: No    Review of Systems  Gastrointestinal: Negative for diarrhea and constipation.  Genitourinary: Negative for dysuria and frequency.  Musculoskeletal: Positive for back pain.  Neurological: Negative for weakness and numbness.  All other systems reviewed and are negative.  Allergies  Review of patient's allergies indicates no known allergies.  Home Medications   Prior  to Admission medications   Medication Sig Start Date End Date Taking? Authorizing Provider  atorvastatin (LIPITOR) 40 MG tablet Take 40 mg by mouth at bedtime.  11/07/11   Historical Provider, MD  ibuprofen (ADVIL,MOTRIN) 600 MG tablet Take 1 tablet (600 mg total) by mouth every 6 (six) hours as needed. 08/15/15   Laurence Spates, MD  naproxen sodium (ANAPROX) 220 MG tablet Take 220 mg by mouth 2 (two) times daily as needed (for pain).    Historical Provider, MD  NEXIUM 40 MG capsule Take 40 mg by mouth daily.  10/11/11   Historical Provider, MD  nortriptyline (PAMELOR) 10 MG capsule Take 1 capsule (10 mg total) by mouth at bedtime. 08/27/15   Drema Dallas, DO  risperiDONE (RISPERDAL) 2 MG tablet Take 1 tablet (2 mg total) by mouth at bedtime. 01/31/16   Cleotis Nipper, MD   BP 126/87 mmHg  Pulse 100  Temp(Src) 97.8 F (36.6 C) (Oral)  Resp 18  SpO2 96% Physical Exam  Constitutional: He is oriented to person, place, and time. He appears well-nourished.  Eyes: Pupils are equal, round, and reactive to light.  Neck: Normal range of motion.  Cardiovascular: Normal rate.   Pulmonary/Chest: Effort normal and breath sounds normal.  Musculoskeletal:       Lumbar back: He exhibits decreased range of motion, tenderness and pain. He exhibits no swelling, no edema, no laceration  and no spasm.       Back:  Neurological: He is alert and oriented to person, place, and time.  Skin: Skin is warm.  Nursing note and vitals reviewed.   ED Course  Procedures (including critical care time) DIAGNOSTIC STUDIES: Oxygen Saturation is 96% on RA, normal by my interpretation.    COORDINATION OF CARE: 11:25 PM-Discussed treatment plan with pt at bedside and pt agreed to plan.   Labs Review Labs Reviewed - No data to display  Imaging Review No results found. I have personally reviewed and evaluated these images and lab results as part of my medical decision-making.   EKG Interpretation None    per  chart review patient has received IM Dilaudid in the past for his pain and FU with PCP Patient is refusing steroid,muscle relaxer  Will be given 1 dose of IM Dilaudid as he has Ultram and Flexeril at home and is taking Alleve  MDM   Final diagnoses:  Bilateral low back pain, with sciatica presence unspecified    I personally performed the services described in this documentation, which was scribed in my presence. The recorded information has been reviewed and is accurate.   Earley Favor, NP 02/04/16 2337  Layla Maw Ward, DO 02/05/16 1610

## 2016-04-30 ENCOUNTER — Ambulatory Visit (HOSPITAL_COMMUNITY): Payer: Self-pay | Admitting: Psychiatry

## 2016-05-13 ENCOUNTER — Encounter (HOSPITAL_COMMUNITY): Payer: Self-pay | Admitting: Psychiatry

## 2016-05-13 ENCOUNTER — Ambulatory Visit (INDEPENDENT_AMBULATORY_CARE_PROVIDER_SITE_OTHER): Payer: Medicare Other | Admitting: Psychiatry

## 2016-05-13 VITALS — BP 128/84 | HR 71 | Ht 71.0 in | Wt 230.8 lb

## 2016-05-13 DIAGNOSIS — F200 Paranoid schizophrenia: Secondary | ICD-10-CM | POA: Diagnosis not present

## 2016-05-13 MED ORDER — RISPERIDONE 2 MG PO TABS: 2.0000 mg | ORAL_TABLET | Freq: Every day | ORAL | Status: DC

## 2016-05-13 NOTE — Progress Notes (Signed)
[facility] [phone] [facility] Note  Roger HerterGrady E Lopez [id number] 60 y.o.  05/13/2016 2:33 PM  Chief Complaint:  Medication management and follow-up.      History of Present Illness:  Roger PuttGrady came for his followup appointment.  He is taking Risperdal at night and he is sleeping better.  He endorse family continued to grieve about losing her niece who died in a car accident while she was [redacted] weeks pregnant.  He is hoping to visit [place] with his wife next month for condolences .  He mentioned his sister is not doing very well and suffering from depression.  He started more involvement in church and going to the groups at [employer] .  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  He likes his medication.  He denies any irritability, paranoia, hallucination or any suicidal thoughts.  His energy level is good.  His appetite is okay.  He denies any feeling of hopelessness or worthlessness.  Patient denies drinking alcohol or using any illegal substances.  He lives with his wife is very supportive.  His vitals are stable.  He will see his primary care physician tomorrow for blood work.  Suicidal Ideation: No Plan Formed: No Patient has means to carry out plan: No  Homicidal Ideation: No Plan Formed: No Patient has means to carry out plan: No  Medical History; Patient has history of back pain, hyperlipidemia and migraine headaches.  He sees Dr. Virgel ManifoldAva.    Review of Systems: Psychiatric: Agitation: No Hallucination: No Depressed Mood: No Insomnia: No Hypersomnia: No Altered Concentration: No Feels Worthless: No Grandiose Ideas: No Belief In Special Powers: No New/Increased Substance Abuse: No Compulsions: No  Neurologic: Headache: No Seizure: No Paresthesias: No    Outpatient Encounter Prescriptions as of 05/13/2016  Medication Sig  . atorvastatin (LIPITOR) 40 MG tablet Take 40 mg by mouth at bedtime.   Marland Kitchen. ibuprofen (ADVIL,MOTRIN) 600 MG tablet Take 1 tablet (600 mg total)  by mouth every 6 (six) hours as needed.  Marland Kitchen. NEXIUM 40 MG capsule Take 40 mg by mouth daily.   . nortriptyline (PAMELOR) 10 MG capsule Take 1 capsule (10 mg total) by mouth at bedtime.  . risperiDONE (RISPERDAL) 2 MG tablet Take 1 tablet (2 mg total) by mouth at bedtime.  . [DISCONTINUED] naproxen sodium (ANAPROX) 220 MG tablet Take 220 mg by mouth 2 (two) times daily as needed (for pain).  . [DISCONTINUED] risperiDONE (RISPERDAL) 2 MG tablet Take 1 tablet (2 mg total) by mouth at bedtime.  . traMADol (ULTRAM) 50 MG tablet TAKE 1-2 TABS BY MOUTH EVERY 8 HRS AS NEEDED FOR PAIN.   No facility-administered encounter medications on file as of 05/13/2016.    No results found for this or any previous visit (from the past 2160 hour(s)).  Past Psychiatric History/Hospitalization(s) Patient is seen in this office since 2005.  He has at least 3 psychiatric hospitalizations.  His last psychiatric admission was in 2011.  Patient has one suicidal attempt by taking overdose on his medication.  He has a history of psychosis paranoia and hallucinations Anxiety: No Bipolar Disorder: No Depression: Yes Mania: No Psychosis: Yes Schizophrenia: Yes Personality Disorder: No Hospitalization for psychiatric illness: Yes History of Electroconvulsive Shock Therapy: No Prior Suicide Attempts: Yes  Physical Exam: Constitutional:  BP 128/84 mmHg  Pulse 71  Ht 5\' 11"  (1.803 m)  Wt 230 lb 12.8 oz (104.69 kg)  BMI 32.20 kg/m2  Musculoskeletal: Strength & Muscle Tone: within normal limits Gait &  Station: normal Patient leans: N/A  Mental Status Examination;  Patient is casually dressed and fairly groomed.  He maintains fair eye contact.  He is pleasant and cooperative.  He denies any active or passive suicidal thoughts or homicidal thoughts.  He denied any auditory or visual hallucination.  He described his mood euthymic and his affect is appropriate.  His attention and concentration is fair.  He has no EPS or  tremors.  There were no delusions present at this time.  His fund of knowledge is adequate.  He is alert and oriented x3.  His insight judgment and impulse control is okay.  Established Problem, Stable/Improving (1), Review of Last Therapy Session (1) and Review of New Medication or Change in Dosage (2)  Assessment: Axis I: Schizophrenia chronic paranoid type  Axis II: Deferred  Axis III:  Past Medical History  Diagnosis Date  . Hypercholesteremia   . Acid reflux   . Head ache   . Back pain    Plan:  Patient is a stable on his current psychiatric medication . One more time I offered counseling but patient refused.  I ask him to have his blood work faxed to [place].  I will continue Risperdal 2 mg at bedtime.  Discussed medication side effects and benefits.  Recommended to call us back if he has any question or any concern.  Follow-up in 3 months.  Lynora Dymond T., MD 05/13/2016

## 2016-08-12 ENCOUNTER — Encounter (HOSPITAL_COMMUNITY): Payer: Self-pay | Admitting: Emergency Medicine

## 2016-08-12 DIAGNOSIS — Z791 Long term (current) use of non-steroidal anti-inflammatories (NSAID): Secondary | ICD-10-CM | POA: Diagnosis not present

## 2016-08-12 DIAGNOSIS — M5442 Lumbago with sciatica, left side: Secondary | ICD-10-CM | POA: Insufficient documentation

## 2016-08-12 DIAGNOSIS — G8929 Other chronic pain: Secondary | ICD-10-CM | POA: Diagnosis not present

## 2016-08-12 DIAGNOSIS — M545 Low back pain: Secondary | ICD-10-CM | POA: Diagnosis present

## 2016-08-12 NOTE — ED Triage Notes (Signed)
Pt states he has problems with sciatica and it has flared back up  Pt states he has pain in his left buttock that goes down his left leg  Pt states it has been hurting for 5 days now  Pt has been taking aleve without relief

## 2016-08-13 ENCOUNTER — Emergency Department (HOSPITAL_COMMUNITY)
Admission: EM | Admit: 2016-08-13 | Discharge: 2016-08-13 | Disposition: A | Discharge status: Home / Self Care | Payer: Medicare Other | Attending: Emergency Medicine | Admitting: Emergency Medicine

## 2016-08-13 ENCOUNTER — Ambulatory Visit (HOSPITAL_COMMUNITY): Payer: Self-pay | Admitting: Psychiatry

## 2016-08-13 DIAGNOSIS — M5432 Sciatica, left side: Secondary | ICD-10-CM

## 2016-08-13 DIAGNOSIS — M545 Low back pain: Secondary | ICD-10-CM

## 2016-08-13 DIAGNOSIS — G8929 Other chronic pain: Secondary | ICD-10-CM

## 2016-08-13 MED ORDER — ONDANSETRON 8 MG PO TBDP
8.0000 mg | ORAL_TABLET | Freq: Once | ORAL | Status: AC
  Administered 2016-08-13: 8 mg via ORAL
  Filled 2016-08-13: qty 1

## 2016-08-13 MED ORDER — HYDROMORPHONE HCL 1 MG/ML IJ SOLN
1.0000 mg | Freq: Once | INTRAMUSCULAR | Status: AC
  Administered 2016-08-13: 1 mg via INTRAMUSCULAR
  Filled 2016-08-13: qty 1

## 2016-08-13 MED ORDER — TRAMADOL HCL 50 MG PO TABS: 50.0000 mg | ORAL_TABLET | Freq: Four times a day (QID) | ORAL | 0 refills | Status: DC | PRN

## 2016-08-13 NOTE — ED Notes (Signed)
Patient's visitor asked this writer how much longer before a doctor will see patient. This Clinical research associatewriter informed visitor we are unable to give exact ETA's on when a provider will see patient. Informed visitor, providers see and exam patient's as quickly as they can. Apologized for long wait and explained to visitor a provider will be in to exam patient and decide the best plan for pain management.

## 2016-08-13 NOTE — ED Notes (Signed)
MD at bedside. 

## 2016-08-13 NOTE — ED Provider Notes (Signed)
[facility]-EMERGENCY DEPT Provider Note   CSN: [id number] Arrival date & time: 08/12/16  2152  By signing my name below, I, Christy SartoriusAnastasia Kolousek, attest that this documentation has been prepared under the direction and in the presence of Dione Boozeavid Keiji Melland, MD . Electronically Signed: Christy SartoriusAnastasia Kolousek, Scribe. 08/13/2016. 2:18 AM.   History   Chief Complaint Chief Complaint  Patient presents with  . Back Pain   The history is provided by the patient. No language interpreter was used.    HPI Comments:  Roger Lopez is a 60 y.o. male who presents to the Emergency Department complaining of 10/10 pain beginning in his left buttock and radiating down his left leg; onset 5 days ago.  Pt states his pain is worse when walking or bending the leg.  Pt normally sees Dr. Ethelene Halamos and is given injections for the pain.  Pt states he was previously seen in the ED for the same pain.  He has been taking Aleve and using a hot pad without relief.  Pt denies weakness, numbness and tingling.       Past Medical History:  Diagnosis Date  . Acid reflux   . Back pain   . Head ache   . Hypercholesteremia     Patient Active Problem List   Diagnosis Date Noted  . Cephalalgia 08/27/2015  . Postconcussion syndrome 08/27/2015    Past Surgical History:  Procedure Laterality Date  . TONSILLECTOMY         Home Medications    Prior to Admission medications   Medication Sig Start Date End Date Taking? Authorizing Provider  atorvastatin (LIPITOR) 40 MG tablet Take 40 mg by mouth at bedtime.  11/07/11   Historical Provider, MD  ibuprofen (ADVIL,MOTRIN) 600 MG tablet Take 1 tablet (600 mg total) by mouth every 6 (six) hours as needed. 08/15/15   Ambrose Finlandachel Morgan Little, MD  NEXIUM 40 MG capsule Take 40 mg by mouth daily.  10/11/11   Historical Provider, MD  nortriptyline (PAMELOR) 10 MG capsule Take 1 capsule (10 mg total) by mouth at bedtime. 08/27/15   Drema DallasAdam R Jaffe, DO  risperiDONE (RISPERDAL) 2 MG tablet Take 1 tablet  (2 mg total) by mouth at bedtime. 05/13/16   Cleotis NipperSyed T [name], MD  traMADol (ULTRAM) 50 MG tablet TAKE 1-2 TABS BY MOUTH EVERY 8 HRS AS NEEDED FOR PAIN. 05/09/16   Historical Provider, MD  traMADol (ULTRAM) 50 MG tablet Take 1 tablet (50 mg total) by mouth every 6 (six) hours as needed. 08/13/16   Dione Boozeavid Jameire Kouba, MD    Family History Family History  Problem Relation Age of Onset  . Bipolar disorder Father   . Bipolar disorder Sister   . Bipolar disorder Sister     Social History Social History  Substance Use Topics  . Smoking status: Never Smoker  . Smokeless tobacco: Never Used  . Alcohol use No     Allergies   Review of patient's allergies indicates no known allergies.   Review of Systems Review of Systems  Musculoskeletal: Positive for myalgias.       Left buttocks  Neurological: Negative for weakness and numbness.     Physical Exam Updated Vital Signs BP 151/91 (BP Location: Right Arm)   Pulse 62   Temp 98.3 F (36.8 C) (Oral)   Resp 18   SpO2 98%   Physical Exam  Constitutional: He is oriented to person, place, and time. He appears well-developed and well-nourished.  HENT:  Head: Normocephalic and atraumatic.  Eyes: EOM are normal. Pupils are equal, round, and reactive to light.  Neck: Normal range of motion. Neck supple. No JVD present.  Cardiovascular: Normal rate, regular rhythm and normal heart sounds.   No murmur heard. Pulmonary/Chest: Effort normal and breath sounds normal. He has no wheezes. He has no rales. He exhibits no tenderness.  Abdominal: Soft. Bowel sounds are normal. He exhibits no distension and no mass. There is no tenderness.  Musculoskeletal: He exhibits no edema.  Positive straight leg raised on left; 15 degrees. Positive crossed strait leg raised; on left 30 degrees,  Lymphadenopathy:    He has no cervical adenopathy.  Neurological: He is alert and oriented to person, place, and time. No cranial nerve deficit. He exhibits normal muscle  tone. Coordination normal.  Skin: Skin is warm and dry. No rash noted.  Psychiatric: He has a normal mood and affect. His behavior is normal. Judgment and thought content normal.  Nursing note and vitals reviewed.    ED Treatments / Results   DIAGNOSTIC STUDIES:  Oxygen Saturation is 98% on RA, nml by my interpretation.    COORDINATION OF CARE:  2:18 AM Discussed treatment plan with pt at bedside and pt agreed to plan.  Procedures Procedures (including critical care time)  Medications Ordered in ED Medications  ondansetron (ZOFRAN-ODT) disintegrating tablet 8 mg (8 mg Oral Given 08/13/16 0222)  HYDROmorphone (DILAUDID) injection 1 mg (1 mg Intramuscular Given 08/13/16 0222)     Initial Impression / Assessment and Plan / ED Course  I have reviewed the triage vital signs and the nursing notes.  Pertinent labs & imaging results that were available during my care of the patient were reviewed by me and considered in my medical decision making (see chart for details).  Clinical Course    Exacerbation of chronic back pain. Patient states that the last emesis here he received an injection that seemed to help him for an extended time. On review of old records, it was noted that he received an injection of hydromorphone 1 mg intramuscularly. He is given an injection of same today and discharged with prescription for tramadol. He states that he has NSAIDs and muscle relaxers at home. He is referred back to his PCP.  Final Clinical Impressions(s) / ED Diagnoses   Final diagnoses:  Acute exacerbation of chronic low back pain  Sciatica of left side    New Prescriptions Discharge Medication List as of 08/13/2016  2:19 AM    START taking these medications   Details  !! traMADol (ULTRAM) 50 MG tablet Take 1 tablet (50 mg total) by mouth every 6 (six) hours as needed., Starting Wed 08/13/2016, Print     !! - Potential duplicate medications found. Please discuss with provider.     I  personally performed the services described in this documentation, which was scribed in my presence. The recorded information has been reviewed and is accurate.      Dione Boozeavid Shayda Kalka, MD 08/13/16 [phone]

## 2016-10-01 ENCOUNTER — Ambulatory Visit (HOSPITAL_COMMUNITY): Payer: Self-pay | Admitting: Psychiatry

## 2016-10-11 ENCOUNTER — Emergency Department (HOSPITAL_COMMUNITY)
Admission: EM | Admit: 2016-10-11 | Discharge: 2016-10-12 | Disposition: A | Discharge status: Home / Self Care | Payer: Medicare Other | Attending: Emergency Medicine | Admitting: Emergency Medicine

## 2016-10-11 ENCOUNTER — Encounter (HOSPITAL_COMMUNITY): Payer: Self-pay | Admitting: Nurse Practitioner

## 2016-10-11 DIAGNOSIS — Z79899 Other long term (current) drug therapy: Secondary | ICD-10-CM | POA: Diagnosis not present

## 2016-10-11 DIAGNOSIS — M5442 Lumbago with sciatica, left side: Secondary | ICD-10-CM | POA: Diagnosis present

## 2016-10-11 DIAGNOSIS — G8929 Other chronic pain: Secondary | ICD-10-CM | POA: Diagnosis not present

## 2016-10-11 DIAGNOSIS — J069 Acute upper respiratory infection, unspecified: Secondary | ICD-10-CM | POA: Diagnosis not present

## 2016-10-11 MED ORDER — HYDROMORPHONE HCL 1 MG/ML IJ SOLN
1.0000 mg | Freq: Once | INTRAMUSCULAR | Status: AC
  Administered 2016-10-11: 1 mg via INTRAMUSCULAR
  Filled 2016-10-11: qty 1

## 2016-10-11 NOTE — ED Provider Notes (Signed)
[facility]-EMERGENCY DEPT Provider Note   CSN: [id number] Arrival date & time: 10/11/16  2102     History   Chief Complaint Chief Complaint  Patient presents with  . Back Pain    HPI Corena HerterGrady E Magno is a 60 y.o. male with a past medical history of chronic back pain, HLD who presents to the ED today complaining of cold symptoms and back pain. Patient states that he has been having runny nose, nasal congestion and sneezing for the last week. Patient also reports diffuse myalgias and non-productive cough. Patient states that due to his sneezing he feels like he may have thrown out his back last night. 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 Patient sees Dr. Ethelene Halamos for this. He has been taking his home tramadol without relief of his back pain. he has also been taking Advil nasal congestion with minimal relief of his cold symptoms. He denies any fevers, chills, vomiting, bowel or bladder incontinence, saddle anesthesia.  HPI  Past Medical History:  Diagnosis Date  . Acid reflux   . Back pain   . Head ache   . Hypercholesteremia     Patient Active Problem List   Diagnosis Date Noted  . Cephalalgia 08/27/2015  . Postconcussion syndrome 08/27/2015    Past Surgical History:  Procedure Laterality Date  . TONSILLECTOMY         Home Medications    Prior to Admission medications   Medication Sig Start Date End Date Taking? Authorizing Provider  atorvastatin (LIPITOR) 40 MG tablet Take 40 mg by mouth at bedtime.  11/07/11   Historical Provider, MD  ibuprofen (ADVIL,MOTRIN) 600 MG tablet Take 1 tablet (600 mg total) by mouth every 6 (six) hours as needed. 08/15/15   Ambrose Finlandachel Morgan Little, MD  NEXIUM 40 MG capsule Take 40 mg by mouth daily.  10/11/11   Historical Provider, MD  nortriptyline (PAMELOR) 10 MG capsule Take 1 capsule (10 mg total) by mouth at bedtime. 08/27/15   Drema DallasAdam R Jaffe, DO  risperiDONE (RISPERDAL) 2 MG tablet Take 1 tablet (2 mg total) by  mouth at bedtime. 05/13/16   Cleotis NipperSyed T [name], MD  traMADol (ULTRAM) 50 MG tablet TAKE 1-2 TABS BY MOUTH EVERY 8 HRS AS NEEDED FOR PAIN. 05/09/16   Historical Provider, MD  traMADol (ULTRAM) 50 MG tablet Take 1 tablet (50 mg total) by mouth every 6 (six) hours as needed. 08/13/16   Dione Boozeavid Glick, MD    Family History Family History  Problem Relation Age of Onset  . Bipolar disorder Father   . Bipolar disorder Sister   . Bipolar disorder Sister     Social History Social History  Substance Use Topics  . Smoking status: Never Smoker  . Smokeless tobacco: Never Used  . Alcohol use No     Allergies   Review of patient's allergies indicates no known allergies.   Review of Systems Review of Systems  All other systems reviewed and are negative.    Physical Exam Updated Vital Signs BP 160/94 (BP Location: Left Arm)   Pulse 89   Temp 98.9 F (37.2 C) (Oral)   Resp 17   Ht 5\' 11"  (1.803 m)   Wt 107.2 kg   SpO2 97%   BMI 32.97 kg/m   Physical Exam  Constitutional: He is oriented to person, place, and time. He appears well-developed and well-nourished. No distress.  HENT:  Head: Normocephalic and atraumatic.  Mouth/Throat: Oropharynx is clear and moist. No  oropharyngeal exudate.  Nasal mucosal edema and clear rhinorrhea present.  Eyes: Conjunctivae and EOM are normal. Pupils are equal, round, and reactive to light. Right eye exhibits no discharge. Left eye exhibits no discharge. No scleral icterus.  Neck: Neck supple.  Cardiovascular: Normal rate, regular rhythm, normal heart sounds and intact distal pulses.  Exam reveals no gallop and no friction rub.   No murmur heard. Pulmonary/Chest: Effort normal and breath sounds normal. No respiratory distress. He has no wheezes. He has no rales. He exhibits no tenderness.  Abdominal: Soft. He exhibits no distension. There is no tenderness. There is no guarding.  Musculoskeletal: Normal range of motion. He exhibits no edema.  No midline  spinal TTP. FROM of C, T, L spine. No step offs or obvious bony deformities. Negative SLR.    Neurological: He is alert and oriented to person, place, and time.  Skin: Skin is warm and dry. No rash noted. He is not diaphoretic. No erythema. No pallor.  Psychiatric: He has a normal mood and affect. His behavior is normal.  Nursing note and vitals reviewed.    ED Treatments / Results  Labs (all labs ordered are listed, but only abnormal results are displayed) Labs Reviewed - No data to display  EKG  EKG Interpretation None       Radiology No results found.  Procedures Procedures (including critical care time)  Medications Ordered in ED Medications  HYDROmorphone (DILAUDID) injection 1 mg (1 mg Intramuscular Given 10/11/16 2329)     Initial Impression / Assessment and Plan / ED Course  I have reviewed the triage vital signs and the nursing notes.  Pertinent labs & imaging results that were available during my care of the patient were reviewed by me and considered in my medical decision making (see chart for details).  Clinical Course    60 year old male with history of chronic back pain presents the ED today with complaint of urinary symptoms and back pain. Patient has been experiencing rhinorrhea, nonproductive cough and sneezing 1 week. Patient states he sneezed very hard last night causing an acute on chronic exacerbation of his lower back pain. This was unrelieved by tramadol. Patient is able to ambulate in the ED but states is painful. No neurological deficits. Patient requesting the pain medication he had at last ED visit for work for him. Upon review of charts, pt received 1mg  dilaudid IM. This was provided again today with significant symptomatic relief.  Pts vitals stable. Doubt PNA. Likely viral etiology of cold symptoms. Recommend foloanse, zyrtec for symptoms relief. Follow up with PCP and with Dr. Ethelene Hal for back pain.   Final Clinical Impressions(s) / ED  Diagnoses   Final diagnoses:  Chronic left-sided low back pain with left-sided sciatica  Viral URI    New Prescriptions New Prescriptions   No medications on file     Dub Mikes, PA-C 10/12/16 0039    Jacalyn Lefevre, MD 10/12/16 1650

## 2016-10-11 NOTE — ED Triage Notes (Signed)
Pt c/o back pain, requesting the pain shot he got last time he was here (chart review indicates Dilaudid IM). Also states he has cold symptoms that is making his pain worse.

## 2016-10-12 NOTE — Discharge Instructions (Signed)
Continue taking home tramadol for pain. Follow up with Dr. Ethelene Halamos for re-evaluation of back pain. Take zyrtec and flonase at home for decongestion. Alternate taking tylenol and ibuprofen for body aches. Follow up with your PCP as needed. Return to the ED if you experience severe worsening of your symptoms, fevers, difficulty breathing, loss of control of your bowels or bladder, numbness or tingling in both of your lower extremities.

## 2016-12-28 ENCOUNTER — Encounter (HOSPITAL_COMMUNITY): Payer: Self-pay

## 2016-12-28 ENCOUNTER — Emergency Department (HOSPITAL_COMMUNITY)
Admission: EM | Admit: 2016-12-28 | Discharge: 2016-12-28 | Disposition: A | Discharge status: Home / Self Care | Payer: Medicare Other | Attending: Emergency Medicine | Admitting: Emergency Medicine

## 2016-12-28 DIAGNOSIS — Z79899 Other long term (current) drug therapy: Secondary | ICD-10-CM | POA: Insufficient documentation

## 2016-12-28 DIAGNOSIS — M549 Dorsalgia, unspecified: Secondary | ICD-10-CM | POA: Diagnosis present

## 2016-12-28 DIAGNOSIS — M5432 Sciatica, left side: Secondary | ICD-10-CM

## 2016-12-28 DIAGNOSIS — M5442 Lumbago with sciatica, left side: Secondary | ICD-10-CM | POA: Insufficient documentation

## 2016-12-28 DIAGNOSIS — G8929 Other chronic pain: Secondary | ICD-10-CM | POA: Insufficient documentation

## 2016-12-28 DIAGNOSIS — M545 Low back pain: Secondary | ICD-10-CM

## 2016-12-28 MED ORDER — METHOCARBAMOL 500 MG PO TABS
500.0000 mg | ORAL_TABLET | Freq: Four times a day (QID) | ORAL | 0 refills | Status: AC | PRN
Start: 2016-12-28 — End: ?

## 2016-12-28 MED ORDER — HYDROMORPHONE HCL 2 MG/ML IJ SOLN
2.0000 mg | Freq: Once | INTRAMUSCULAR | Status: AC
  Administered 2016-12-28: 2 mg via INTRAMUSCULAR
  Filled 2016-12-28: qty 1

## 2016-12-28 MED ORDER — PREDNISONE 10 MG (21) PO TBPK: 10.0000 mg | ORAL_TABLET | Freq: Every day | ORAL | 0 refills | Status: DC

## 2016-12-28 MED ORDER — HYDROCODONE-ACETAMINOPHEN 5-325 MG PO TABS: 1.0000 | ORAL_TABLET | ORAL | 0 refills | Status: DC | PRN

## 2016-12-28 NOTE — ED Triage Notes (Signed)
PT C/O CHRONIC LOWER BACK PAIN SINCE THIS AFTERNOON. PT STS THE PAIN GOES DOWN THE LEFT LEG WITH NUMBNESS TO THE TOES. DENIES INJURY.

## 2016-12-28 NOTE — Discharge Instructions (Signed)
Read the information below.  Use the prescribed medication as directed.  Please discuss all new medications with your pharmacist.  Do not take additional tylenol while taking the prescribed pain medication to avoid overdose.  You may return to the Emergency Department at any time for worsening condition or any new symptoms that concern you.    If you develop fevers, loss of control of bowel or bladder, weakness or numbness in your legs, or are unable to walk, return to the ER for a recheck.  °

## 2016-12-28 NOTE — ED Provider Notes (Signed)
[facility]-EMERGENCY DEPT Provider Note   CSN: [id number] Arrival date & time: 12/28/16  1806  By signing my name below, I, Clovis Pu, attest that this documentation has been prepared under the direction and in the presence of  [facility] [place], PA-C. Electronically Signed: Clovis Pu, ED Scribe. 12/28/16. 7:02 PM.   History   Chief Complaint Chief Complaint  Patient presents with  . Back Pain   The history is provided by the patient. No language interpreter was used.   HPI Comments:  Roger Lopez is a 60 y.o. male, with a hx of sciatica, who presents to the Emergency Department complaining of sudden onset, moderate back pain x today.  This feels exactly like prior flare ups of his chronic sciatic pain.  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 Pt states his pain began after he lifted his leg to step up. He reports radiation of the pain to the left lower extremity just to the left knee, numbness in the toes of his left foot and notes his pain is worse with movement of his left leg. Pt states he has been piling wood on his porch recently and thinks this caused the flare up. He has had aleve and tramadol with no relief. Pt denies any recent injury, recent falls, new weakness, new numbness, any other associated symptoms and any other modifying factors at this time. He is ambulatory. Pt states he was recently given an injection by Dr. Ethelene Hal which provided relief until today's pain. His last imaging was done last year.   PCP: Hoyle Sauer, MD   Past Medical History:  Diagnosis Date  . Acid reflux   . Back pain   . Head ache   . Hypercholesteremia     Patient Active Problem List   Diagnosis Date Noted  . Cephalalgia 08/27/2015  . Postconcussion syndrome 08/27/2015    Past Surgical History:  Procedure Laterality Date  . TONSILLECTOMY         Home Medications    Prior to Admission medications   Medication Sig Start Date End Date Taking?  Authorizing Provider  atorvastatin (LIPITOR) 40 MG tablet Take 40 mg by mouth at bedtime.  11/07/11   Historical Provider, MD  HYDROcodone-acetaminophen (NORCO/VICODIN) 5-325 MG tablet Take 1-2 tablets by mouth every 4 (four) hours as needed for moderate pain or severe pain. 12/28/16   Trixie Dredge, PA-C  ibuprofen (ADVIL,MOTRIN) 600 MG tablet Take 1 tablet (600 mg total) by mouth every 6 (six) hours as needed. 08/15/15   Laurence Spates, MD  methocarbamol (ROBAXIN) 500 MG tablet Take 1-2 tablets (500-1,000 mg total) by mouth every 6 (six) hours as needed for muscle spasms (and pain). 12/28/16   Trixie Dredge, PA-C  NEXIUM 40 MG capsule Take 40 mg by mouth daily.  10/11/11   Historical Provider, MD  nortriptyline (PAMELOR) 10 MG capsule Take 1 capsule (10 mg total) by mouth at bedtime. 08/27/15   Drema Dallas, DO  predniSONE (STERAPRED UNI-PAK 21 TAB) 10 MG (21) TBPK tablet Take 1 tablet (10 mg total) by mouth daily. Day 1: take 6 tabs.  Day 2: 5 tabs  Day 3: 4 tabs  Day 4: 3 tabs  Day 5: 2 tabs  Day 6: 1 tab 12/28/16   Trixie Dredge, PA-C  risperiDONE (RISPERDAL) 2 MG tablet Take 1 tablet (2 mg total) by mouth at bedtime. 05/13/16   Cleotis Nipper, MD  traMADol (ULTRAM) 50 MG tablet TAKE 1-2  TABS BY MOUTH EVERY 8 HRS AS NEEDED FOR PAIN. 05/09/16   Historical Provider, MD  traMADol (ULTRAM) 50 MG tablet Take 1 tablet (50 mg total) by mouth every 6 (six) hours as needed. 08/13/16   Dione Boozeavid Glick, MD    Family History Family History  Problem Relation Age of Onset  . Bipolar disorder Father   . Bipolar disorder Sister   . Bipolar disorder Sister     Social History Social History  Substance Use Topics  . Smoking status: Never Smoker  . Smokeless tobacco: Never Used  . Alcohol use No     Allergies   Patient has no known allergies.   Review of Systems Review of Systems  Constitutional: Negative for chills and fever.  Gastrointestinal: Negative for abdominal pain.  Genitourinary: Negative for  difficulty urinating, dysuria, frequency and urgency.  Musculoskeletal: Positive for back pain and myalgias.  Neurological: Negative for weakness and numbness.  Psychiatric/Behavioral: Negative for self-injury.     Physical Exam Updated Vital Signs BP 130/96 (BP Location: Right Arm)   Pulse 84   Temp 98.8 F (37.1 C) (Oral)   Resp 20   Ht 5\' 11"  (1.803 m)   Wt 104.3 kg   SpO2 100%   BMI 32.08 kg/m   Physical Exam  Constitutional: He appears well-developed and well-nourished. No distress.  HENT:  Head: Normocephalic and atraumatic.  Neck: Neck supple.  Pulmonary/Chest: Effort normal.  Abdominal: Soft. He exhibits no distension. There is no tenderness. There is no rebound and no guarding.  Musculoskeletal:  Spine nontender, no crepitus, or stepoffs. Lower extremities:  Strength 5/5, sensation intact, distal pulses intact.    Mild tenderness through left lower back and buttock.    Neurological: He is alert.  Skin: He is not diaphoretic.  Nursing note and vitals reviewed.    ED Treatments / Results  DIAGNOSTIC STUDIES:  Oxygen Saturation is 100% on RA, normal by my interpretation.    COORDINATION OF CARE:  6:56 PM Discussed treatment plan with pt at bedside and pt agreed to plan.  Labs (all labs ordered are listed, but only abnormal results are displayed) Labs Reviewed - No data to display  EKG  EKG Interpretation None       Radiology No results found.  Procedures Procedures (including critical care time)  Medications Ordered in ED Medications  HYDROmorphone (DILAUDID) injection 2 mg (2 mg Intramuscular Given 12/28/16 1933)     Initial Impression / Assessment and Plan / ED Course  I have reviewed the triage vital signs and the nursing notes.  Pertinent labs & imaging results that were available during my care of the patient were reviewed by me and considered in my medical decision making (see chart for details).  Clinical Course as of Dec 29 2015    Sun Dec 28, 2016  1831 [place] DEA database. 12/18/16 #180 Tramadol 50mg  12/05/16 #30 Percocet 10-325 11/27/16 #20 Percocet 5-325 11/19/16 #180 Tramadol 50mg   [EW]    Clinical Course User Index [EW] Trixie DredgeEmily Harlie Buening, PA-C    Patient with acute on chronic back pain with sciatica.  No neurological deficits and normal neuro exam.  Patient is ambulatory.  No loss of bowel or bladder control.  No concern for cauda equina. No red flags. No urinary symptoms suggestive of UTI.  Supportive care and return precaution discussed. Appears safe for discharge at this time. Follow up as indicated in discharge paperwork.  Discussed result, findings, treatment, and follow up  with patient.  Pt  given return precautions.  Pt verbalizes understanding and agrees with plan.      Final Clinical Impressions(s) / ED Diagnoses   Final diagnoses:  Acute exacerbation of chronic low back pain  Sciatica of left side    New Prescriptions Discharge Medication List as of 12/28/2016  7:19 PM    START taking these medications   Details  HYDROcodone-acetaminophen (NORCO/VICODIN) 5-325 MG tablet Take 1-2 tablets by mouth every 4 (four) hours as needed for moderate pain or severe pain., Starting Sun 12/28/2016, Print    methocarbamol (ROBAXIN) 500 MG tablet Take 1-2 tablets (500-1,000 mg total) by mouth every 6 (six) hours as needed for muscle spasms (and pain)., Starting Sun 12/28/2016, Print    predniSONE (STERAPRED UNI-PAK 21 TAB) 10 MG (21) TBPK tablet Take 1 tablet (10 mg total) by mouth daily. Day 1: take 6 tabs.  Day 2: 5 tabs  Day 3: 4 tabs  Day 4: 3 tabs  Day 5: 2 tabs  Day 6: 1 tab, Starting Sun 12/28/2016, Print       I personally performed the services described in this documentation, which was scribed in my presence. The recorded information has been reviewed and is accurate.     Trixie Dredgemily Lera Gaines, PA-C 12/28/16 2016    Arby BarretteMarcy Pfeiffer, MD 12/31/16 [phone]

## 2017-01-01 ENCOUNTER — Ambulatory Visit (HOSPITAL_COMMUNITY): Payer: Self-pay | Admitting: Psychiatry

## 2017-01-12 ENCOUNTER — Ambulatory Visit (HOSPITAL_COMMUNITY): Payer: Self-pay | Admitting: Psychiatry

## 2017-02-13 ENCOUNTER — Ambulatory Visit (HOSPITAL_COMMUNITY): Payer: Self-pay | Admitting: Psychiatry

## 2017-02-17 ENCOUNTER — Ambulatory Visit (INDEPENDENT_AMBULATORY_CARE_PROVIDER_SITE_OTHER): Payer: Medicare Other | Admitting: Psychiatry

## 2017-02-17 ENCOUNTER — Encounter (HOSPITAL_COMMUNITY): Payer: Self-pay | Admitting: Psychiatry

## 2017-02-17 DIAGNOSIS — Z79899 Other long term (current) drug therapy: Secondary | ICD-10-CM

## 2017-02-17 DIAGNOSIS — Z9889 Other specified postprocedural states: Secondary | ICD-10-CM

## 2017-02-17 DIAGNOSIS — Z818 Family history of other mental and behavioral disorders: Secondary | ICD-10-CM

## 2017-02-17 DIAGNOSIS — F200 Paranoid schizophrenia: Secondary | ICD-10-CM | POA: Diagnosis not present

## 2017-02-17 MED ORDER — RISPERIDONE 2 MG PO TABS: 2.0000 mg | ORAL_TABLET | Freq: Every day | ORAL | 1 refills | Status: AC

## 2017-02-17 NOTE — Progress Notes (Signed)
[facility] MD/PA/NP OP Progress Note  02/17/2017 4:34 PM Roger Lopez  MRN:  [id number]  Chief Complaint:  Subjective:  I'm doing good but some nights I cannot sleep because of back pain.  HPI: Patient came for his follow-up appointment.  He is taking his medication and denies any paranoia or any hallucination.  Lately he has complained of insomnia because of back pain.  He visited twice emergency room for that reason.  He is getting pain medication but some nights he is a still in pain.  He denies any irritability, mania, psychosis or any hallucination.  He had a quiet Christmas.  Though he involved in church activities and some time go to the groups at [employer].  He liked his medication and does not want to change or reduce the dose.  He denies any suicidal thoughts or homicidal thoughts.  He lives with his wife is supportive.  Patient denies drinking alcohol or using any illegal substances.  His appetite is okay.  Her vital signs are stable.  Patient is scheduled to see his primary care physician on March 5 for his blood work and routine checkup.  Visit Diagnosis:    ICD-9-CM ICD-10-CM   1. 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 Patient is seen in this office since 2005.  He has at least 3 psychiatric hospitalizations.  His last psychiatric admission was in 2011.  Patient has one suicidal attempt by taking overdose on his medication.  He has a history of psychosis paranoia and hallucinations  Past Medical History: His primary care physician is Dr. Virgel Manifold. Past Medical History:  Diagnosis Date  . Acid reflux   . Back pain   . Head ache   . Hypercholesteremia     Past Surgical History:  Procedure Laterality Date  . TONSILLECTOMY      Family Psychiatric History: Reviewed.  Family History:  Family History  Problem Relation Age of Onset  . Bipolar disorder Father   . Bipolar disorder  Sister   . Bipolar disorder Sister     Social History:  Social History   Social History  . Marital status: Married    Spouse name: N/A  . Number of children: N/A  . Years of education: N/A   Social History Main Topics  . Smoking status: Never Smoker  . Smokeless tobacco: Never Used  . Alcohol use No  . Drug use: No  . Sexual activity: Yes    Birth control/ protection: None   Other Topics Concern  . Not on file   Social History Narrative   Lives with wife in a one story home.  Has 6 children.  Retired from [employer].      Allergies: No Known Allergies  Metabolic Disorder Labs: No results found for: HGBA1C, MPG No results found for: PROLACTIN No results found for: CHOL, TRIG, HDL, CHOLHDL, VLDL, LDLCALC   Current Medications: Current Outpatient Prescriptions  Medication Sig Dispense Refill  . atorvastatin (LIPITOR) 40 MG tablet Take 40 mg by mouth at bedtime.     Marland Kitchen HYDROcodone-acetaminophen (NORCO/VICODIN) 5-325 MG tablet Take 1-2 tablets by mouth every 4 (four) hours as needed for moderate pain or severe pain. 10 tablet 0  . ibuprofen (ADVIL,MOTRIN) 600 MG tablet Take 1 tablet (600 mg total) by mouth every 6 (six) hours as needed. 15 tablet 0  . methocarbamol (ROBAXIN) 500 MG tablet Take 1-2 tablets (500-1,000 mg total) by mouth every  6 (six) hours as needed for muscle spasms (and pain). 20 tablet 0  . NEXIUM 40 MG capsule Take 40 mg by mouth daily.     . nortriptyline (PAMELOR) 10 MG capsule Take 1 capsule (10 mg total) by mouth at bedtime. 30 capsule 3  . predniSONE (STERAPRED UNI-PAK 21 TAB) 10 MG (21) TBPK tablet Take 1 tablet (10 mg total) by mouth daily. Day 1: take 6 tabs.  Day 2: 5 tabs  Day 3: 4 tabs  Day 4: 3 tabs  Day 5: 2 tabs  Day 6: 1 tab 21 tablet 0  . risperiDONE (RISPERDAL) 2 MG tablet Take 1 tablet (2 mg total) by mouth at bedtime. 30 tablet 2  . traMADol (ULTRAM) 50 MG tablet TAKE 1-2 TABS BY MOUTH EVERY 8 HRS AS NEEDED FOR PAIN.  4  . traMADol (ULTRAM)  50 MG tablet Take 1 tablet (50 mg total) by mouth every 6 (six) hours as needed. 15 tablet 0   No current facility-administered medications for this visit.     Neurologic: Headache: No Seizure: No Paresthesias: neuropayhty pain in feet  Musculoskeletal: Strength & Muscle Tone: within normal limits Gait & Station: normal Patient leans: N/A  Psychiatric Specialty Exam: Review of Systems  Constitutional: Negative.   Musculoskeletal: Positive for back pain and joint pain.  Skin: Negative.   Neurological: Positive for tingling.       Neuropathy in his feet    There were no vitals taken for this visit.There is no height or weight on file to calculate BMI.  General Appearance: Casual  Eye Contact:  Fair  Speech:  Slow  Volume:  Normal  Mood:  Euthymic  Affect:  Appropriate and Congruent  Thought Process:  Goal Directed  Orientation:  Full (Time, Place, and Person)  Thought Content: WDL and Logical   Suicidal Thoughts:  No  Homicidal Thoughts:  No  Memory:  Immediate;   Fair Recent;   Fair Remote;   Fair  Judgement:  Good  Insight:  Good  Psychomotor Activity:  Normal  Concentration:  Concentration: Fair and Attention Span: Fair  Recall:  FiservFair  Fund of Knowledge: Fair  Language: Fair  Akathisia:  No  Handed:  Right  AIMS (if indicated):  0  Assets:  Communication Skills Desire for Improvement Housing Resilience  ADL's:  Intact  Cognition: WNL  Sleep:  fair   Assessment: Schizophrenia chronic paranoid type.  Plan: Patient is a stable on Risperdal.  Discussed medication side effects and benefits.  Patient is scheduled to see his primary care physician on fifth and I recommended to have his blood work faxed to us.  Continue Risperdal 2 mg at bedtime.  Follow-up in 6 months. Discussed medication side effects and benefits.  Recommended to call us back if there is any question, concern or worsening of the symptoms.  Discuss safety plan that anytime having active suicidal  thoughts or homicidal thoughts and she need to call 911 or go to the local emergency room.  Lyanna Blystone T., MD 02/17/2017, 4:34 PM

## 2017-07-29 ENCOUNTER — Encounter (HOSPITAL_COMMUNITY): Payer: Self-pay | Admitting: Emergency Medicine

## 2017-07-29 ENCOUNTER — Emergency Department (HOSPITAL_COMMUNITY)
Admission: EM | Admit: 2017-07-29 | Discharge: 2017-07-29 | Disposition: A | Discharge status: Home / Self Care | Payer: Medicare Other | Attending: Emergency Medicine | Admitting: Emergency Medicine

## 2017-07-29 DIAGNOSIS — Z79899 Other long term (current) drug therapy: Secondary | ICD-10-CM | POA: Diagnosis not present

## 2017-07-29 DIAGNOSIS — M5441 Lumbago with sciatica, right side: Secondary | ICD-10-CM | POA: Insufficient documentation

## 2017-07-29 DIAGNOSIS — M545 Low back pain: Secondary | ICD-10-CM | POA: Diagnosis present

## 2017-07-29 DIAGNOSIS — G8929 Other chronic pain: Secondary | ICD-10-CM

## 2017-07-29 MED ORDER — KETOROLAC TROMETHAMINE 30 MG/ML IJ SOLN
30.0000 mg | Freq: Once | INTRAMUSCULAR | Status: DC
  Filled 2017-07-29: qty 1

## 2017-07-29 NOTE — ED Triage Notes (Signed)
Pt states he has trouble with his sciatic nerve and over the past 3 days it has been getting worse  Pt states he has been using OTC medication but it has gotten to where he feels he needs something stronger  Pt states he has an appt with a dr on the 10th but needs something to get him through til then

## 2017-07-29 NOTE — Discharge Instructions (Signed)
Follow up with orthopedics as directed. Continue home medications as previously prescribed. Return to ED for numbness, weakness, trouble walking, loss of bladder function, injury or falls.

## 2017-07-29 NOTE — ED Provider Notes (Signed)
[facility]ERGENCY DEPT Provider Note   CSN: [id number] Arrival date & time: 07/29/17  0551     History   Chief Complaint Chief Complaint  Patient presents with  . Back Pain    HPI Roger Lopez is a 61 y.o. male.  HPI  Patient, with a past medical history of chronic back pain and sciatica, presents to ED for evaluation of 3 day history of lower back pain that he states has gradually been getting worse. He states that he has been having problems with his sciatica for the past few years. He has tried Aleve and Tylenol with no relief in his symptoms. He states that he does not take anything else for his pain. Is scheduled to meet with his orthopedist in 9 days but would like pain controlled until then. He denies any injuries, falls, urinary incontinence, bowel incontinence, dysuria, hematuria, fevers, chest pain, shortness of breath, prior back surgery, history of cancer, history of IV drug use, numbness or trouble walking.  Past Medical History:  Diagnosis Date  . Acid reflux   . Back pain   . Head ache   . Hypercholesteremia     Patient Active Problem List   Diagnosis Date Noted  . Cephalalgia 08/27/2015  . Postconcussion syndrome 08/27/2015    Past Surgical History:  Procedure Laterality Date  . TONSILLECTOMY         Home Medications    Prior to Admission medications   Medication Sig Start Date End Date Taking? Authorizing Provider  atorvastatin (LIPITOR) 40 MG tablet Take 40 mg by mouth at bedtime.  11/07/11  Yes [provider]  fluticasone (FLONASE) 50 MCG/ACT nasal spray Place 1 spray into both nostrils daily as needed for allergies or rhinitis.   Yes [provider]  naproxen sodium (ANAPROX) 220 MG tablet Take 440 mg by mouth 2 (two) times daily as needed. Pain   Yes [provider]  NEXIUM 40 MG capsule Take 40 mg by mouth daily.  10/11/11  Yes [provider]  tetrahydrozoline 0.05 % ophthalmic solution Place 1 drop into both  eyes 2 (two) times daily as needed. Itching   Yes [provider]  traMADol (ULTRAM) 50 MG tablet TAKE 1-2 TABS BY MOUTH EVERY 8 HRS AS NEEDED FOR PAIN. 05/09/16  Yes [provider]  ibuprofen (ADVIL,MOTRIN) 600 MG tablet Take 1 tablet (600 mg total) by mouth every 6 (six) hours as needed. Patient not taking: Reported on 07/29/2017 08/15/15   Little, Ambrose Finlandachel Morgan, MD  methocarbamol (ROBAXIN) 500 MG tablet Take 1-2 tablets (500-1,000 mg total) by mouth every 6 (six) hours as needed for muscle spasms (and pain). Patient not taking: Reported on 07/29/2017 12/28/16   Trixie DredgeWest, Emily, PA-C  nortriptyline (PAMELOR) 10 MG capsule Take 1 capsule (10 mg total) by mouth at bedtime. Patient not taking: Reported on 07/29/2017 08/27/15   Drema DallasJaffe, Adam R, DO  risperiDONE (RISPERDAL) 2 MG tablet Take 1 tablet (2 mg total) by mouth at bedtime. Patient not taking: Reported on 07/29/2017 02/17/17   Cleotis NipperArfeen, Syed T, MD    Family History Family History  Problem Relation Age of Onset  . Bipolar disorder Father   . Bipolar disorder Sister   . Bipolar disorder Sister   . Diabetes Other     Social History Social History  Substance Use Topics  . Smoking status: Never Smoker  . Smokeless tobacco: Never Used  . Alcohol use No     Allergies   Patient has no  known allergies.   Review of Systems Review of Systems  Constitutional: Negative for appetite change, chills and fever.  HENT: Negative for ear pain, rhinorrhea, sneezing and sore throat.   Eyes: Negative for photophobia and visual disturbance.  Respiratory: Negative for cough, chest tightness, shortness of breath and wheezing.   Cardiovascular: Negative for chest pain and palpitations.  Gastrointestinal: Negative for abdominal pain, blood in stool, constipation, diarrhea, nausea and vomiting.  Genitourinary: Negative for dysuria, hematuria and urgency.  Musculoskeletal: Positive for back pain. Negative for myalgias.  Skin: Negative for rash.    Neurological: Negative for dizziness, weakness and light-headedness.     Physical Exam Updated Vital Signs BP (!) 160/89 (BP Location: Left Arm)   Pulse 82   Temp 98.8 F (37.1 C) (Oral)   Resp 18   Ht 5\' 11"  (1.803 m)   Wt 103 kg (227 lb)   SpO2 98%   BMI 31.66 kg/m   Physical Exam  Constitutional: He appears well-developed and well-nourished. No distress.  HENT:  Head: Normocephalic and atraumatic.  Nose: Nose normal.  Eyes: Conjunctivae and EOM are normal. Left eye exhibits no discharge. No scleral icterus.  Neck: Normal range of motion. Neck supple.  Cardiovascular: Normal rate, regular rhythm, normal heart sounds and intact distal pulses.  Exam reveals no gallop and no friction rub.   No murmur heard. Pulmonary/Chest: Effort normal and breath sounds normal. No respiratory distress.  Abdominal: Soft. Bowel sounds are normal. He exhibits no distension. There is no tenderness. There is no guarding.  Musculoskeletal: Normal range of motion. He exhibits tenderness. He exhibits no edema.  Tenderness to palpation in the right paraspinal musculature. No midline spinal tenderness present in lumbar, thoracic or cervical spine. No step-off palpated. No visible bruising, edema or temperature change noted. No objective signs of numbness present. No saddle anesthesia. 2+ DP pulses bilaterally. Sensation intact to light touch. Strength 5/5 in bilateral lower extremities.   Neurological: He is alert. He exhibits normal muscle tone. Coordination normal.  Skin: Skin is warm and dry. No rash noted.  Psychiatric: He has a normal mood and affect.  Nursing note and vitals reviewed.    ED Treatments / Results  Labs (all labs ordered are listed, but only abnormal results are displayed) Labs Reviewed - No data to display  EKG  EKG Interpretation None       Radiology No results found.  Procedures Procedures (including critical care time)  Medications Ordered in ED Medications   ketorolac (TORADOL) 30 MG/ML injection 30 mg (30 mg Intramuscular Refused 07/29/17 0746)     Initial Impression / Assessment and Plan / ED Course  I have reviewed the triage vital signs and the nursing notes.  Pertinent labs & imaging results that were available during my care of the patient were reviewed by me and considered in my medical decision making (see chart for details).     Patient presents to ED for evaluation of chronic back pain and right-sided sciatica that he states that he has had for several years. He denies any injury, trauma, prior back surgery, numbness, weakness, urinary incontinence. There is some right sided tenderness to palpation of the lumbar paraspinal musculature. Sensation intact to light touch. Strength 5/5 in bilateral lower extremities. He is afebrile with no history of fever. He is sitting comfortably on the exam table. Patient states that he does not take anything for pain besides from over-the-counter medication. However, with review of the narcotic database it appears that patient  receives Percocet 10 mg monthly as well as tramadol that was refilled less than 1 month ago. He states that he does not take narcotic medication at home because it constipates him, but it appears that he gets it refilled monthly. I offered patient steroids or anti-inflammatories here but he refused and stated that he only wanted IM Dilaudid which she states has worked for him in the past. I continuously told him that we do not treat chronic pain with narcotics but he states that he is not willing to try anything else. He states that he will call his orthopedist for refill of his pain medication if we are not able to give it to him here. He states that he wants to leave if he cannot have the IM Dilaudid. Patient provided with discharge materials and strict return precautions for severe or worsening symptoms.  Final Clinical Impressions(s) / ED Diagnoses   Final diagnoses:  Chronic  right-sided low back pain with right-sided sciatica    New Prescriptions New Prescriptions   No medications on file     Dietrich PatesKhatri, Faron Tudisco, PA-C 07/29/17 0809    Melene PlanFloyd, Dan, DO 07/29/17 [id number]

## 2017-08-18 ENCOUNTER — Ambulatory Visit (HOSPITAL_COMMUNITY): Payer: Self-pay | Admitting: Psychiatry
